# Patient Record
Sex: Female | Born: 1940
Health system: Southern US, Community
[De-identification: ages and names within clinical notes are randomized; demographics above are authoritative.]

## PROBLEM LIST (undated history)

## (undated) DIAGNOSIS — J449 Chronic obstructive pulmonary disease, unspecified: Secondary | ICD-10-CM

## (undated) DIAGNOSIS — R51 Headache: Secondary | ICD-10-CM

## (undated) DIAGNOSIS — I509 Heart failure, unspecified: Secondary | ICD-10-CM

## (undated) DIAGNOSIS — K5909 Other constipation: Secondary | ICD-10-CM

## (undated) DIAGNOSIS — M545 Low back pain, unspecified: Secondary | ICD-10-CM

## (undated) DIAGNOSIS — F419 Anxiety disorder, unspecified: Secondary | ICD-10-CM

## (undated) DIAGNOSIS — I209 Angina pectoris, unspecified: Secondary | ICD-10-CM

## (undated) DIAGNOSIS — D649 Anemia, unspecified: Secondary | ICD-10-CM

## (undated) DIAGNOSIS — I10 Essential (primary) hypertension: Secondary | ICD-10-CM

## (undated) DIAGNOSIS — K219 Gastro-esophageal reflux disease without esophagitis: Secondary | ICD-10-CM

## (undated) DIAGNOSIS — M1990 Unspecified osteoarthritis, unspecified site: Secondary | ICD-10-CM

## (undated) HISTORY — DX: Gastro-esophageal reflux disease without esophagitis: K21.9

## (undated) HISTORY — PX: ABDOMINAL HYSTERECTOMY: SHX81

## (undated) HISTORY — DX: Unspecified osteoarthritis, unspecified site: M19.90

## (undated) HISTORY — DX: Other constipation: K59.09

## (undated) HISTORY — DX: Low back pain: M54.5

## (undated) HISTORY — DX: Essential (primary) hypertension: I10

## (undated) HISTORY — DX: Low back pain, unspecified: M54.50

## (undated) HISTORY — PX: KNEE SURGERY: SHX244

---

## 2005-03-07 ENCOUNTER — Ambulatory Visit: Payer: Self-pay | Admitting: Physical Medicine & Rehabilitation

## 2005-03-07 ENCOUNTER — Inpatient Hospital Stay (HOSPITAL_COMMUNITY): Admission: RE | Admit: 2005-03-07 | Discharge: 2005-03-13 | Payer: Self-pay | Admitting: Specialist

## 2005-06-02 ENCOUNTER — Ambulatory Visit (HOSPITAL_COMMUNITY): Admission: RE | Admit: 2005-06-02 | Discharge: 2005-06-02 | Payer: Self-pay | Admitting: Specialist

## 2011-08-01 DIAGNOSIS — N189 Chronic kidney disease, unspecified: Secondary | ICD-10-CM

## 2011-08-01 HISTORY — DX: Chronic kidney disease, unspecified: N18.9

## 2011-11-09 ENCOUNTER — Ambulatory Visit (INDEPENDENT_AMBULATORY_CARE_PROVIDER_SITE_OTHER): Payer: Medicare Other | Admitting: Gastroenterology

## 2011-11-09 ENCOUNTER — Encounter: Payer: Self-pay | Admitting: Gastroenterology

## 2011-11-09 VITALS — BP 112/70 | HR 88 | Temp 97.5°F | Ht 64.0 in | Wt 209.8 lb

## 2011-11-09 DIAGNOSIS — R590 Localized enlarged lymph nodes: Secondary | ICD-10-CM

## 2011-11-09 DIAGNOSIS — Z1211 Encounter for screening for malignant neoplasm of colon: Secondary | ICD-10-CM

## 2011-11-09 DIAGNOSIS — K5900 Constipation, unspecified: Secondary | ICD-10-CM

## 2011-11-09 DIAGNOSIS — R599 Enlarged lymph nodes, unspecified: Secondary | ICD-10-CM

## 2011-11-09 MED ORDER — SOD PICOSULFATE-MAG OX-CIT ACD 10-3.5-12 MG-GM-GM PO PACK: 1.0000 | PACK | Freq: Once | ORAL | Status: DC

## 2011-11-09 NOTE — Progress Notes (Signed)
Primary Care Physician:  SHAH,ASHISH, MD, MD Primary Gastroenterologist:  Dr. Rourk  Chief Complaint  Patient presents with  . Colonoscopy    HPI:   [name] presents today at the request of Dr. Shah for an initial screening colonoscopy. Reports chronic constipation, takes Metamucil with good results. Denies any rectal bleeding. Notes hx of reflux, GERD controls well. States lower abdominal bloating, discomfort with constipation, then relieved. No wt loss or lack of appetite. States no energy for years. However, notes stressors such as worrying about family, children, grandchildren.      Past Medical History  Diagnosis Date  . Osteoarthritis   . Lower back pain   . GERD (gastroesophageal reflux disease)   . Chronic constipation   . Hypertension     Past Surgical History  Procedure Date  . Knee surgery     right  . Abdominal hysterectomy     Current Outpatient Prescriptions  Medication Sig Dispense Refill  . ALPRAZolam (XANAX) 0.5 MG tablet Take 0.5 mg by mouth at bedtime as needed.       . amLODipine-benazepril (LOTREL) 10-20 MG per capsule Take 1 capsule by mouth daily.       . calcium carbonate (OS-CAL) 600 MG TABS Take 600 mg by mouth 2 (two) times daily with a meal.      . fexofenadine (ALLEGRA) 180 MG tablet Take 180 mg by mouth daily.      . fish oil-omega-3 fatty acids 1000 MG capsule Take 2 g by mouth daily.      . furosemide (LASIX) 40 MG tablet Take 40 mg by mouth daily.       . ibuprofen (ADVIL,MOTRIN) 800 MG tablet Take 800 mg by mouth every 8 (eight) hours as needed.       . KLOR-CON M20 20 MEQ tablet Take 20 mEq by mouth daily.       . metaxalone (SKELAXIN) 800 MG tablet Take 800 mg by mouth 3 (three) times daily.      . NEXIUM 40 MG capsule Take 40 mg by mouth daily before breakfast.       . Oxycodone HCl 20 MG TABS Take 20 mg by mouth every 4 (four) hours while awake.       . venlafaxine (EFFEXOR) 75 MG tablet Take 75 mg by mouth 2 (two) times daily.        . peg 3350 powder (MOVIPREP) SOLR Take 1 kit (100 g total) by mouth once. As directed Please purchase 1 Fleets enema to use with the prep  1 kit  0  . Sod Picosulfate-Mag Ox-Cit Acd (PREPOPIK) 10-3.5-12 MG-GM-GM PACK Take 1 kit by mouth once.  1 each  0    Allergies as of 11/09/2011 - Review Complete 11/09/2011  Allergen Reaction Noted  . Hydrocodone Itching 11/09/2011    Family History  Problem Relation Age of Onset  . Colon cancer Neg Hx     History   Social History  . Marital Status: Single    Spouse Name: N/A    Number of Children: N/A  . Years of Education: N/A   Occupational History  . Not on file.   Social History Main Topics  . Smoking status: Former Smoker -- 0.5 packs/day    Types: Cigarettes  . Smokeless tobacco: Not on file   Comment: quit about 35+ years ago  . Alcohol Use: Yes     very little(wine)  . Drug Use: No  . Sexually Active: Not on file     Other Topics Concern  . Not on file   Social History Narrative  . No narrative on file    Review of Systems: Gen: Denies any fever, chills, fatigue, weight loss, lack of appetite.  CV: Denies chest pain, heart palpitations, peripheral edema, syncope.  Resp: Denies shortness of breath at rest or with exertion. Denies wheezing or cough.  GI: Denies dysphagia or odynophagia. Denies jaundice, hematemesis, fecal incontinence. GU : Denies urinary burning, urinary frequency, urinary hesitancy MS: Denies joint pain, muscle weakness, cramps, or limitation of movement.  Derm: Denies rash, itching, dry skin Psych: Denies depression, anxiety, memory loss, and confusion Heme: Denies bruising, bleeding, and enlarged lymph nodes.  Physical Exam: BP 112/70  Pulse 88  Temp(Src) 97.5 F (36.4 C) (Temporal)  Ht 5' 4" (1.626 m)  Wt 209 lb 12.8 oz (95.165 kg)  BMI 36.01 kg/m2 General:   Alert and oriented. Pleasant and cooperative. Well-nourished and well-developed.  Head:  Normocephalic and atraumatic. Eyes:   Without icterus, sclera clear and conjunctiva pink.  Ears:  Normal auditory acuity. Nose:  No deformity, discharge,  or lesions. Mouth:  No deformity or lesions, oral mucosa pink.  Neck:  Supple, without mass or thyromegaly. Lungs:  Clear to auscultation bilaterally. No wheezes, rales, or rhonchi. No distress.  Heart:  S1, S2 present without murmurs appreciated.  Abdomen:  +BS, soft, non-tender and non-distended. No HSM noted. No guarding or rebound. No masses appreciated.  Rectal:  Deferred  Msk:  Symmetrical without gross deformities. Normal posture. Extremities:  Without clubbing or edema. Neurologic:  Alert and  oriented x4;  grossly normal neurologically. Skin:  Intact without significant lesions or rashes. Cervical Nodes:  Right submandibular cervical adenopathy, non-tender, non-fixed. Pt states recent sinus flare.  Psych:  Alert and cooperative. Normal mood and affect.    

## 2011-11-09 NOTE — Patient Instructions (Addendum)
We have set you up for a colonoscopy with Dr. Jena Gauss in the near future.   Continue to take supplemental fiber.   You may take a probiotic daily. This can be found over the counter. We have provided samples.   Make sure to follow-up with Dr. Sherryll Burger as already planned. We will be sending this note to him as well.

## 2011-11-10 ENCOUNTER — Encounter: Payer: Self-pay | Admitting: Gastroenterology

## 2011-11-10 ENCOUNTER — Other Ambulatory Visit: Payer: Self-pay | Admitting: Gastroenterology

## 2011-11-10 MED ORDER — PEG-KCL-NACL-NASULF-NA ASC-C 100 G PO SOLR: 1.0000 | Freq: Once | ORAL | Status: DC

## 2011-11-11 DIAGNOSIS — R590 Localized enlarged lymph nodes: Secondary | ICD-10-CM | POA: Insufficient documentation

## 2011-11-11 DIAGNOSIS — Z1211 Encounter for screening for malignant neoplasm of colon: Secondary | ICD-10-CM | POA: Insufficient documentation

## 2011-11-11 DIAGNOSIS — K5900 Constipation, unspecified: Secondary | ICD-10-CM | POA: Insufficient documentation

## 2011-11-11 NOTE — Assessment & Plan Note (Signed)
Continue Metamucil. Add Probiotic. Screening TCS in near future.

## 2011-11-11 NOTE — Assessment & Plan Note (Signed)
Non-tender submandibular cervical adenopathy, right side. Notes recent sinus issues. Will cc PCP on this. Likely reactive. Doubt underlying issue. Keep appt with Dr. Sherryll Burger that is upcoming.

## 2011-11-11 NOTE — Assessment & Plan Note (Addendum)
71 year old female with need for initial screening colonoscopy. Chronic constipation, relieved with Metamucil prn. No rectal bleeding, abdominal pain. No FH of colon cancer. No other symptoms.   Proceed with TCS with Dr. Jena Gauss in near future: the risks, benefits, and alternatives have been discussed with the patient in detail. The patient states understanding and desires to proceed. Phenergan 12.5 mg IV prior due to polypharmacy.

## 2011-11-13 NOTE — Progress Notes (Signed)
Faxed to PCP

## 2011-11-29 ENCOUNTER — Encounter (HOSPITAL_COMMUNITY): Payer: Self-pay | Admitting: Pharmacy Technician

## 2011-11-29 MED ORDER — SODIUM CHLORIDE 0.45 % IV SOLN
Freq: Once | INTRAVENOUS | Status: AC
  Administered 2011-11-30: 09:00:00 via INTRAVENOUS

## 2011-11-30 ENCOUNTER — Encounter (HOSPITAL_COMMUNITY): Payer: Self-pay

## 2011-11-30 ENCOUNTER — Encounter (HOSPITAL_COMMUNITY)
Admission: RE | Disposition: A | Discharge status: Home Health | Payer: Self-pay | Source: Ambulatory Visit | Attending: Internal Medicine

## 2011-11-30 ENCOUNTER — Ambulatory Visit (HOSPITAL_COMMUNITY)
Admission: RE | Admit: 2011-11-30 | Discharge: 2011-11-30 | Disposition: A | Discharge status: Home Health | Payer: Medicare Other | Source: Ambulatory Visit | Attending: Internal Medicine | Admitting: Internal Medicine

## 2011-11-30 DIAGNOSIS — I10 Essential (primary) hypertension: Secondary | ICD-10-CM | POA: Insufficient documentation

## 2011-11-30 DIAGNOSIS — K648 Other hemorrhoids: Secondary | ICD-10-CM | POA: Insufficient documentation

## 2011-11-30 DIAGNOSIS — Z1211 Encounter for screening for malignant neoplasm of colon: Secondary | ICD-10-CM

## 2011-11-30 DIAGNOSIS — Z79899 Other long term (current) drug therapy: Secondary | ICD-10-CM | POA: Insufficient documentation

## 2011-11-30 HISTORY — PX: COLONOSCOPY: SHX5424

## 2011-11-30 SURGERY — COLONOSCOPY
Anesthesia: Moderate Sedation

## 2011-11-30 MED ORDER — MEPERIDINE HCL 100 MG/ML IJ SOLN
INTRAMUSCULAR | Status: DC | PRN
  Administered 2011-11-30 (×4): 25 mg via INTRAVENOUS
  Administered 2011-11-30: 50 mg via INTRAVENOUS

## 2011-11-30 MED ORDER — MEPERIDINE HCL 100 MG/ML IJ SOLN
INTRAMUSCULAR | Status: AC
  Filled 2011-11-30: qty 2

## 2011-11-30 MED ORDER — STERILE WATER FOR IRRIGATION IR SOLN
Status: DC | PRN
  Administered 2011-11-30: 11:00:00

## 2011-11-30 MED ORDER — MIDAZOLAM HCL 5 MG/5ML IJ SOLN
INTRAMUSCULAR | Status: DC | PRN
  Administered 2011-11-30: 2 mg via INTRAVENOUS
  Administered 2011-11-30 (×2): 1 mg via INTRAVENOUS
  Administered 2011-11-30: 2 mg via INTRAVENOUS
  Administered 2011-11-30: 1 mg via INTRAVENOUS
  Administered 2011-11-30: 2 mg via INTRAVENOUS

## 2011-11-30 MED ORDER — PROMETHAZINE HCL 25 MG/ML IJ SOLN
INTRAMUSCULAR | Status: AC
  Administered 2011-11-30: 12.5 mg via INTRAVENOUS
  Filled 2011-11-30: qty 1

## 2011-11-30 MED ORDER — PROMETHAZINE HCL 25 MG/ML IJ SOLN
12.5000 mg | Freq: Once | INTRAMUSCULAR | Status: AC
  Administered 2011-11-30: 12.5 mg via INTRAVENOUS

## 2011-11-30 MED ORDER — MIDAZOLAM HCL 5 MG/5ML IJ SOLN
INTRAMUSCULAR | Status: AC
  Filled 2011-11-30: qty 10

## 2011-11-30 NOTE — Progress Notes (Signed)
IV discontinued from right wrist. Patient arrived to post-op with IV to right wrist. Site clean, dry, and intact.

## 2011-11-30 NOTE — Discharge Instructions (Signed)

## 2011-11-30 NOTE — H&P (View-Only) (Signed)
Primary Care Physician:  Kirstie Peri, MD, MD Primary Gastroenterologist:  Dr. Jena Gauss  Chief Complaint  Patient presents with  . Colonoscopy    HPI:   [name] presents today at the request of Dr. Sherryll Burger for an initial screening colonoscopy. Reports chronic constipation, takes Metamucil with good results. Denies any rectal bleeding. Notes hx of reflux, GERD controls well. States lower abdominal bloating, discomfort with constipation, then relieved. No wt loss or lack of appetite. States no energy for years. However, notes stressors such as worrying about family, children, grandchildren.      Past Medical History  Diagnosis Date  . Osteoarthritis   . Lower back pain   . GERD (gastroesophageal reflux disease)   . Chronic constipation   . Hypertension     Past Surgical History  Procedure Date  . Knee surgery     right  . Abdominal hysterectomy     Current Outpatient Prescriptions  Medication Sig Dispense Refill  . ALPRAZolam (XANAX) 0.5 MG tablet Take 0.5 mg by mouth at bedtime as needed.       Marland Kitchen amLODipine-benazepril (LOTREL) 10-20 MG per capsule Take 1 capsule by mouth daily.       . calcium carbonate (OS-CAL) 600 MG TABS Take 600 mg by mouth 2 (two) times daily with a meal.      . fexofenadine (ALLEGRA) 180 MG tablet Take 180 mg by mouth daily.      . fish oil-omega-3 fatty acids 1000 MG capsule Take 2 g by mouth daily.      . furosemide (LASIX) 40 MG tablet Take 40 mg by mouth daily.       Marland Kitchen ibuprofen (ADVIL,MOTRIN) 800 MG tablet Take 800 mg by mouth every 8 (eight) hours as needed.       Marland Kitchen KLOR-CON M20 20 MEQ tablet Take 20 mEq by mouth daily.       . metaxalone (SKELAXIN) 800 MG tablet Take 800 mg by mouth 3 (three) times daily.      Marland Kitchen NEXIUM 40 MG capsule Take 40 mg by mouth daily before breakfast.       . Oxycodone HCl 20 MG TABS Take 20 mg by mouth every 4 (four) hours while awake.       . venlafaxine (EFFEXOR) 75 MG tablet Take 75 mg by mouth 2 (two) times daily.        . peg 3350 powder (MOVIPREP) SOLR Take 1 kit (100 g total) by mouth once. As directed Please purchase 1 Fleets enema to use with the prep  1 kit  0  . Sod Picosulfate-Mag Ox-Cit Acd (PREPOPIK) 10-3.5-12 MG-GM-GM PACK Take 1 kit by mouth once.  1 each  0    Allergies as of 11/09/2011 - Review Complete 11/09/2011  Allergen Reaction Noted  . Hydrocodone Itching 11/09/2011    Family History  Problem Relation Age of Onset  . Colon cancer Neg Hx     History   Social History  . Marital Status: Single    Spouse Name: N/A    Number of Children: N/A  . Years of Education: N/A   Occupational History  . Not on file.   Social History Main Topics  . Smoking status: Former Smoker -- 0.5 packs/day    Types: Cigarettes  . Smokeless tobacco: Not on file   Comment: quit about 35+ years ago  . Alcohol Use: Yes     very little(wine)  . Drug Use: No  . Sexually Active: Not on file  Other Topics Concern  . Not on file   Social History Narrative  . No narrative on file    Review of Systems: Gen: Denies any fever, chills, fatigue, weight loss, lack of appetite.  CV: Denies chest pain, heart palpitations, peripheral edema, syncope.  Resp: Denies shortness of breath at rest or with exertion. Denies wheezing or cough.  GI: Denies dysphagia or odynophagia. Denies jaundice, hematemesis, fecal incontinence. GU : Denies urinary burning, urinary frequency, urinary hesitancy MS: Denies joint pain, muscle weakness, cramps, or limitation of movement.  Derm: Denies rash, itching, dry skin Psych: Denies depression, anxiety, memory loss, and confusion Heme: Denies bruising, bleeding, and enlarged lymph nodes.  Physical Exam: BP 112/70  Pulse 88  Temp(Src) 97.5 F (36.4 C) (Temporal)  Ht 5\' 4"  (1.626 m)  Wt 209 lb 12.8 oz (95.165 kg)  BMI 36.01 kg/m2 General:   Alert and oriented. Pleasant and cooperative. Well-nourished and well-developed.  Head:  Normocephalic and atraumatic. Eyes:   Without icterus, sclera clear and conjunctiva pink.  Ears:  Normal auditory acuity. Nose:  No deformity, discharge,  or lesions. Mouth:  No deformity or lesions, oral mucosa pink.  Neck:  Supple, without mass or thyromegaly. Lungs:  Clear to auscultation bilaterally. No wheezes, rales, or rhonchi. No distress.  Heart:  S1, S2 present without murmurs appreciated.  Abdomen:  +BS, soft, non-tender and non-distended. No HSM noted. No guarding or rebound. No masses appreciated.  Rectal:  Deferred  Msk:  Symmetrical without gross deformities. Normal posture. Extremities:  Without clubbing or edema. Neurologic:  Alert and  oriented x4;  grossly normal neurologically. Skin:  Intact without significant lesions or rashes. Cervical Nodes:  Right submandibular cervical adenopathy, non-tender, non-fixed. Pt states recent sinus flare.  Psych:  Alert and cooperative. Normal mood and affect.

## 2011-11-30 NOTE — Interval H&P Note (Signed)
History and Physical Interval Note:  11/30/2011 10:39 AM  Jessica Lopez  has presented today for surgery, with the diagnosis of screening CRC  The various methods of treatment have been discussed with the patient and family. After consideration of risks, benefits and other options for treatment, the patient has consented to  Procedure(s) (LRB): COLONOSCOPY (N/A) as a surgical intervention .  The patients' history has been reviewed, patient examined, no change in status, stable for surgery.  I have reviewed the patients' chart and labs.  Questions were answered to the patient's satisfaction.     Eula Listen

## 2011-11-30 NOTE — Op Note (Signed)
[facility] [address] [place], [place]  [address]  COLONOSCOPY PROCEDURE REPORT  PATIENT:  Jessica Lopez, Jessica Lopez  MR#:  [id number] BIRTHDATE:  1941-01-10, 70 yrs. old  GENDER:  female ENDOSCOPIST:  R. Roetta Sessions, MD FACP [facility] REF. BY:  Kirstie Peri, M.D. PROCEDURE DATE:  11/30/2011 PROCEDURE:  screening colonoscopy.  INDICATIONS:  first ever average risk screening examination.  INFORMED CONSENT:  The risks, benefits, alternatives and imponderables including but not limited to bleeding, perforation as well as the possibility of a missed lesion have been reviewed. The potential for biopsy, lesion removal, etc. have also been discussed.  Questions have been answered.  All parties agreeable. Please see the history and physical in the medical record for more information.  MEDICATIONS:  Versed 9 mg IV and Demerol 150 mg IV in divided doses.  DESCRIPTION OF PROCEDURE:  After a digital rectal exam was performed, the EC-[id number] ([id number]) colonoscope was advanced from the anus through the rectum and colon to the area of the cecum, ileocecal valve and appendiceal orifice.  The cecum was deeply intubated.  These structures were well-seen and photographed for the record.  From the level of the cecum and ileocecal valve, the scope was slowly and cautiously withdrawn.  The mucosal surfaces were carefully surveyed utilizing scope tip deflection to facilitate fold flattening as needed.  The scope was pulled down into the rectum where a thorough examination including retroflexion was performed. <<PROCEDUREIMAGES>>  FINDINGS:  adequate preparation.  anal papilla and internal hemorrhoids otherwise normal rectum. Somewhat long, tortuous, but otherwise normal-appearing colonic mucosa.  THERAPEUTIC / DIAGNOSTIC MANEUVERS PERFORMED:none  COMPLICATIONS:  none  CECAL WITHDRAWAL TIME:10 minutes  IMPRESSION:   Anal papilla/internal hemorrhoids.  Rectum and colon appeared otherwise  normal  RECOMMENDATIONS:    Repeat screening colonoscopy in 10 years  ______________________________ R. Roetta Sessions, MD Caleen Essex  CC:  Kirstie Peri, M.D.  n. eSIGNED:   R. Roetta Sessions at 11/30/2011 11:23 AM  Malena Peer, [phone]

## 2011-12-04 ENCOUNTER — Encounter (HOSPITAL_COMMUNITY): Payer: Self-pay | Admitting: Internal Medicine

## 2013-01-24 ENCOUNTER — Encounter: Payer: Self-pay | Admitting: Internal Medicine

## 2013-01-27 ENCOUNTER — Ambulatory Visit (INDEPENDENT_AMBULATORY_CARE_PROVIDER_SITE_OTHER): Payer: Medicare Other | Admitting: Gastroenterology

## 2013-01-27 ENCOUNTER — Encounter: Payer: Self-pay | Admitting: Gastroenterology

## 2013-01-27 VITALS — BP 152/70 | HR 75 | Temp 97.8°F | Ht 64.0 in | Wt 226.4 lb

## 2013-01-27 DIAGNOSIS — R635 Abnormal weight gain: Secondary | ICD-10-CM | POA: Insufficient documentation

## 2013-01-27 DIAGNOSIS — K5900 Constipation, unspecified: Secondary | ICD-10-CM

## 2013-01-27 MED ORDER — LINACLOTIDE 290 MCG PO CAPS: 290.0000 ug | ORAL_CAPSULE | Freq: Every day | ORAL | Status: AC

## 2013-01-27 NOTE — Assessment & Plan Note (Addendum)
72 y/o with constipation in setting of chronic narcotics. Suspect abdominal discomfort related to constipation. She is up-to-date on her colonoscopy as her last one was one year ago. No indication for colonoscopy at this time.  1. Increase Linzess to daily. 2. Continue probiotic. 3. Continue Metamucil 4. Keep stool Journal. 5. Increase dietary fiber and fluid intake. 6. Office visit in 6 weeks. If no significant improvement in her abdominal discomfort despite improvement in her bowel function, consider CT of the abdomen and pelvis at that time. 7. Call sooner if needed.

## 2013-01-27 NOTE — Progress Notes (Signed)
Cc PCP 

## 2013-01-27 NOTE — Assessment & Plan Note (Signed)
Given increased weight and change in bowel function we will check thyroid status.

## 2013-01-27 NOTE — Progress Notes (Signed)
Primary Care Physician: Kirstie Peri, MD Referring Physician:  Reather Littler, MD Primary Gastroenterologist:  Roetta Sessions, MD   Chief Complaint  Patient presents with  . Constipation    HPI: Jessica Lopez is a 72 y.o. female here for further evaluation of severe constipation. She had a colonoscopy in May 2013 by Dr. Jena Gauss. She had anal papilla/internal hemorrhoids otherwise normal exam. She will not need another colonoscopy for 10 years unless she develops any significant problems.  Patient complains of worsening baseline constipation over the past several months. She has been on [employer] daily. Usually takes Metamucil prn. Recently was started on Amitiza by Dr. Andrez Grime for about one month. Now on Linzess daily by Dr. Sherryll Burger. C/O 6 week h/o mid-abdominal discomfort. Dr. Sherryll Burger and Dr. Glade Lloyd thinks she has pain secondary to constipation. BM couple of times per week if she stays on her medications. No rectal pain/bleeding. No melena. No n/v, anorexia. Heartburn ok on Nexium. Weight up 17 pounds. Had went to weight loss center and lost a lot of weight two years ago. Chronic oxycodone for back/knee pain. Takes on regular schedule.   Current Outpatient Prescriptions  Medication Sig Dispense Refill  . ALPRAZolam (XANAX) 0.25 MG tablet 1 tablet 4 (four) times daily as needed.      Marland Kitchen amLODipine-benazepril (LOTREL) 5-20 MG per capsule 1 capsule daily.      . bifidobacterium infantis (ALIGN) capsule Take 1 capsule by mouth daily.      . calcium carbonate (OS-CAL) 600 MG TABS Take 600 mg by mouth 2 (two) times daily with a meal.      . DULoxetine (CYMBALTA) 60 MG capsule Take 60 mg by mouth daily.       . fexofenadine (ALLEGRA) 180 MG tablet Take 180 mg by mouth daily.      . fish oil-omega-3 fatty acids 1000 MG capsule Take 2 g by mouth daily.      . furosemide (LASIX) 40 MG tablet Take 40 mg by mouth daily.       Marland Kitchen ibuprofen (ADVIL,MOTRIN) 800 MG tablet Take 800 mg by mouth every 8  (eight) hours as needed.       Marland Kitchen KLOR-CON M20 20 MEQ tablet Take 20 mEq by mouth daily.       Marland Kitchen LINZESS 145 MCG CAPS Take 145 mcg by mouth daily.       . metaxalone (SKELAXIN) 800 MG tablet Take 800 mg by mouth 3 (three) times daily.      Marland Kitchen NEXIUM 40 MG capsule Take 40 mg by mouth daily before breakfast.       . Oxycodone HCl 20 MG TABS Take 20 mg by mouth every 4 (four) hours while awake.       . rizatriptan (MAXALT) 10 MG tablet Take 10 mg by mouth as needed.       Marland Kitchen tiZANidine (ZANAFLEX) 4 MG tablet Take 4 mg by mouth every 8 (eight) hours as needed.       . venlafaxine (EFFEXOR) 75 MG tablet Take 75 mg by mouth 2 (two) times daily.        No current facility-administered medications for this visit.    Allergies as of 01/27/2013 - Review Complete 01/27/2013  Allergen Reaction Noted  . Hydrocodone Itching 11/09/2011    ROS:  General: Negative for anorexia, weight loss, fever, chills, fatigue, weakness. ENT: Negative for hoarseness, difficulty swallowing , nasal congestion. CV: Negative for chest pain, angina, palpitations, dyspnea on exertion, peripheral edema.  Respiratory: Negative  for dyspnea at rest, dyspnea on exertion, cough, sputum, wheezing.  GI: See history of present illness. GU:  Negative for dysuria, hematuria, urinary incontinence, urinary frequency, nocturnal urination.  Endo: Negative for unusual weight change.    Physical Examination:   BP 152/70  Pulse 75  Temp(Src) 97.8 F (36.6 C) (Oral)  Ht 5\' 4"  (1.626 m)  Wt 226 lb 6.4 oz (102.694 kg)  BMI 38.84 kg/m2  General: Well-nourished, well-developed in no acute distress.  Eyes: No icterus. Mouth: Oropharyngeal mucosa moist and pink , no lesions erythema or exudate. Lungs: Clear to auscultation bilaterally.  Heart: Regular rate and rhythm, no murmurs rubs or gallops.  Abdomen: Bowel sounds are normal, nontender, nondistended, no hepatosplenomegaly or masses, no abdominal bruits or hernia , no rebound or  guarding.   Extremities: No lower extremity edema. No clubbing or deformities. Neuro: Alert and oriented x 4   Skin: Warm and dry, no jaundice.   Psych: Alert and cooperative, normal mood and affect.   Imaging Studies: No results found.

## 2013-01-27 NOTE — Patient Instructions (Addendum)
1. Please stop Linzess you are on now. Start increased dose of one pill daily. New RX sent to your pharmacy. 2. Lab work to check thyroid. 3. Keep a stool internal. Document when you have a bowel movement, whether is soft or hard, when you have abdominal pain. Please document each day if you did not take the align and Linzess or if you added Metamucil. 4. Office visit in six weeks. Please call if you do not get relief from your constipation or your abdominal pain worsens.  High-Fiber Diet Fiber is found in fruits, vegetables, and grains. A high-fiber diet encourages the addition of more whole grains, legumes, fruits, and vegetables in your diet. The recommended amount of fiber for adult males is 38 g per day. For adult females, it is 25 g per day. Pregnant and lactating women should get 28 g of fiber per day. If you have a digestive or bowel problem, ask your caregiver for advice before adding high-fiber foods to your diet. Eat a variety of high-fiber foods instead of only a select few type of foods.  PURPOSE  To increase stool bulk.  To make bowel movements more regular to prevent constipation.  To lower cholesterol.  To prevent overeating. WHEN IS THIS DIET USED?  It may be used if you have constipation and hemorrhoids.  It may be used if you have uncomplicated diverticulosis (intestine condition) and irritable bowel syndrome.  It may be used if you need help with weight management.  It may be used if you want to add it to your diet as a protective measure against atherosclerosis, diabetes, and cancer. SOURCES OF FIBER  Whole-grain breads and cereals.  Fruits, such as apples, oranges, bananas, berries, prunes, and pears.  Vegetables, such as green peas, carrots, sweet potatoes, beets, broccoli, cabbage, spinach, and artichokes.  Legumes, such split peas, soy, lentils.  Almonds. FIBER CONTENT IN FOODS Starches and Grains / Dietary Fiber (g)  Cheerios, 1 cup / 3  g  Corn Flakes cereal, 1 cup / 0.7 g  Rice crispy treat cereal, 1 cup / 0.3 g  Instant oatmeal (cooked),  cup / 2 g  Frosted wheat cereal, 1 cup / 5.1 g  Brown, long-grain rice (cooked), 1 cup / 3.5 g  White, long-grain rice (cooked), 1 cup / 0.6 g  Enriched macaroni (cooked), 1 cup / 2.5 g Legumes / Dietary Fiber (g)  Baked beans (canned, plain, or vegetarian),  cup / 5.2 g  Kidney beans (canned),  cup / 6.8 g  Pinto beans (cooked),  cup / 5.5 g Breads and Crackers / Dietary Fiber (g)  Plain or honey graham crackers, 2 squares / 0.7 g  Saltine crackers, 3 squares / 0.3 g  Plain, salted pretzels, 10 pieces / 1.8 g  Whole-wheat bread, 1 slice / 1.9 g  White bread, 1 slice / 0.7 g  Raisin bread, 1 slice / 1.2 g  Plain bagel, 3 oz / 2 g  Flour tortilla, 1 oz / 0.9 g  Corn tortilla, 1 small / 1.5 g  Hamburger or hotdog bun, 1 small / 0.9 g Fruits / Dietary Fiber (g)  Apple with skin, 1 medium / 4.4 g  Sweetened applesauce,  cup / 1.5 g  Banana,  medium / 1.5 g  Grapes, 10 grapes / 0.4 g  Orange, 1 small / 2.3 g  Raisin, 1.5 oz / 1.6 g  Melon, 1 cup / 1.4 g Vegetables / Dietary Fiber (g)  [place]  beans (canned),  cup / 1.3 g  Carrots (cooked),  cup / 2.3 g  Broccoli (cooked),  cup / 2.8 g  Peas (cooked),  cup / 4.4 g  Mashed potatoes,  cup / 1.6 g  Lettuce, 1 cup / 0.5 g  Corn (canned),  cup / 1.6 g  Tomato,  cup / 1.1 g Document Released: 07/17/2005 Document Revised: 01/16/2012 Document Reviewed: 10/19/2011 [facility] Patient Information 2014 [place], [place].  Constipation, Adult Constipation is when a person has fewer than 3 bowel movements a week; has difficulty having a bowel movement; or has stools that are dry, hard, or larger than normal. As people grow older, constipation is more common. If you try to fix constipation with medicines that make you have a bowel movement (laxatives), the problem may get worse. Long-term laxative  use may cause the muscles of the colon to become weak. A low-fiber diet, not taking in enough fluids, and taking certain medicines may make constipation worse. CAUSES   Certain medicines, such as antidepressants, pain medicine, iron supplements, antacids, and water pills.   Certain diseases, such as diabetes, irritable bowel syndrome (IBS), thyroid disease, or depression.   Not drinking enough water.   Not eating enough fiber-rich foods.   Stress or travel.  Lack of physical activity or exercise.  Not going to the restroom when there is the urge to have a bowel movement.  Ignoring the urge to have a bowel movement.  Using laxatives too much. SYMPTOMS   Having fewer than 3 bowel movements a week.   Straining to have a bowel movement.   Having hard, dry, or larger than normal stools.   Feeling full or bloated.   Pain in the lower abdomen.  Not feeling relief after having a bowel movement. DIAGNOSIS  Your caregiver will take a medical history and perform a physical exam. Further testing may be done for severe constipation. Some tests may include:   A barium enema X-ray to examine your rectum, colon, and sometimes, your small intestine.  A sigmoidoscopy to examine your lower colon.  A colonoscopy to examine your entire colon. TREATMENT  Treatment will depend on the severity of your constipation and what is causing it. Some dietary treatments include drinking more fluids and eating more fiber-rich foods. Lifestyle treatments may include regular exercise. If these diet and lifestyle recommendations do not help, your caregiver may recommend taking over-the-counter laxative medicines to help you have bowel movements. Prescription medicines may be prescribed if over-the-counter medicines do not work.  HOME CARE INSTRUCTIONS   Increase dietary fiber in your diet, such as fruits, vegetables, whole grains, and beans. Limit high-fat and processed sugars in your diet, such as  [place] fries, hamburgers, cookies, candies, and soda.   A fiber supplement may be added to your diet if you cannot get enough fiber from foods.   Drink enough fluids to keep your urine clear or pale yellow.   Exercise regularly or as directed by your caregiver.   Go to the restroom when you have the urge to go. Do not hold it.  Only take medicines as directed by your caregiver. Do not take other medicines for constipation without talking to your caregiver first. SEEK IMMEDIATE MEDICAL CARE IF:   You have bright red blood in your stool.   Your constipation lasts for more than 4 days or gets worse.   You have abdominal or rectal pain.   You have thin, pencil-like stools.  You have unexplained weight loss. MAKE  SURE YOU:   Understand these instructions.  Will watch your condition.  Will get help right away if you are not doing well or get worse. Document Released: 04/14/2004 Document Revised: 10/09/2011 Document Reviewed: 06/20/2011 [facility] Patient Information 2014 [place], [place].

## 2013-01-29 NOTE — Progress Notes (Signed)
Quick Note:  Please let pt knowTSH normal. ______

## 2013-01-30 NOTE — Progress Notes (Signed)
Quick Note:  Pt aware ______ 

## 2013-02-14 ENCOUNTER — Ambulatory Visit (INDEPENDENT_AMBULATORY_CARE_PROVIDER_SITE_OTHER): Payer: Medicare Other | Admitting: Urology

## 2013-02-14 DIAGNOSIS — R3129 Other microscopic hematuria: Secondary | ICD-10-CM

## 2013-02-14 DIAGNOSIS — R82998 Other abnormal findings in urine: Secondary | ICD-10-CM

## 2013-03-12 ENCOUNTER — Ambulatory Visit: Payer: Medicare Other | Admitting: Gastroenterology

## 2013-03-12 ENCOUNTER — Telehealth: Payer: Self-pay | Admitting: *Deleted

## 2013-03-12 NOTE — Telephone Encounter (Signed)
Pt was a no show

## 2013-03-25 ENCOUNTER — Other Ambulatory Visit: Payer: Self-pay | Admitting: Orthopedic Surgery

## 2013-03-26 ENCOUNTER — Encounter (HOSPITAL_COMMUNITY): Payer: Self-pay | Admitting: Pharmacy Technician

## 2013-03-28 ENCOUNTER — Ambulatory Visit: Payer: Medicare Other | Admitting: Urology

## 2013-04-03 ENCOUNTER — Other Ambulatory Visit: Payer: Self-pay | Admitting: Orthopedic Surgery

## 2013-04-03 NOTE — H&P (Signed)
Alfredo Batty DOB: Aug 04, 1940 Female  H&P date: 04/02/13  Chief complaint: Left knee pain  History of Present Illness The patient is a 72 year old female who comes in today for a preoperative History and Physical. The patient is scheduled for a left total knee arthroplasty to be performed by Dr. Javier Docker, MD at [facility] on April 10, 2013. She reports L knee pain for many years, progressively worsening. Refractory to steroid injections, bracing, activity modification, relative rest, opioid pain medications, NSAIDs, quad strengthening. Dr. Shelle Iron and the patient mutually agreed to proceed with a total knee replacement. Risks and benefits of the procedure were discussed including stiffness, suboptimal range of motion, persistent pain, infection requiring removal of prosthesis and reinsertion, need for prophylactic antibiotics in the future, for example, dental procedures, possible need for manipulation, revision in the future and also anesthetic complications including DVT, PE, etc. We discussed the perioperative course, time in the hospital, postoperative recovery and the need for elevation to control swelling. We also discussed the predicted range of motion and the probability that squatting and kneeling would be unobtainable in the future. In addition, postoperative anticoagulation was discussed. We will obtain preoperative medical clearance if necessary. Provided her illustrated handout and discussed it in detail. They will enroll in the total joint replacement educational forum at the hospital. She has been cleared by Dr. Sherryll Burger for surgery.  Past Medical Hx Anxiety Disorder Congestive Heart Failure Coronary artery disease Gastroesophageal Reflux Disease High blood pressure Hypercholesterolemia Osteoporosis Rheumatoid Arthritis  Allergies Hydrocodone/Acetaminophen *ANALGESICS - OPIOID*. itching at high doses  Family History Cancer. sister Diabetes  Mellitus. child  Social History Alcohol use. current drinker; drinks wine; only occasionally per week Children. 5 or more Current work status. retired Financial planner (Currently). no Drug/Alcohol Rehab (Previously). no Exercise. Exercises never Illicit drug use. no Living situation. live with spouse Marital status. married Number of flights of stairs before winded. less than 1 Pain Contract. yes Tobacco / smoke exposure. no Tobacco use. former smoker; smoke(d) less than 1/2 pack(s) per day Post-Surgical Plans. rehab  Medication History OxyCODONE HCl (20MG  Tablet, Oral) Active. ALPRAZolam ( Oral) Specific dose unknown - Active. Amlodipine Besy-Benazepril HCl (10-20MG  Capsule, Oral) Active. Cyclobenzaprine HCl (10MG  Tablet, Oral) Active. Furosemide (40MG  Tablet, Oral) Active. Klor-Con M20 ( Tablet ER, Oral) Active. NexIUM (40MG  Capsule DR, Oral) Active. FLUoxetine HCl (20MG  Capsule, Oral) Active. Folic Acid (1MG  Tablet, Oral) Active. Amitiza ( Capsule, Oral) Active. Vitamin D3 (1000UNIT Tablet, Oral) Active. Omega 3 ( Oral) Specific dose unknown - Active. Oxycodone HCl (20MG  Tablet ER, Oral) Active. Ibuprofen (800MG  Tablet, Oral) Active. TiZANidine HCl (4MG  Tablet, Oral) Active.  Pregnancy / Birth History Pregnant. no  Past Surgical History Hysterectomy. complete (non-cancerous) Total Knee Replacement. right  Review of Systems General:Present- Night Sweats and Fatigue. Not Present- Chills, Fever, Weight Gain, Weight Loss and Memory Loss. Skin:Not Present- Hives, Itching, Rash, Eczema and Lesions. HEENT:Not Present- Tinnitus, Headache, Double Vision, Visual Loss, Hearing Loss and Dentures. Respiratory:Not Present- Shortness of breath with exertion, Shortness of breath at rest, Allergies, Coughing up blood and Chronic Cough. Cardiovascular:Present- Swelling. Not Present- Chest Pain, Racing/skipping heartbeats, Difficulty Breathing  Lying Down, Murmur and Palpitations. Gastrointestinal:Present- Heartburn and Constipation. Not Present- Bloody Stool, Abdominal Pain, Vomiting, Nausea, Diarrhea, Difficulty Swallowing, Jaundice and Loss of appetitie. Female Genitourinary:Present- Blood in Urine. Not Present- Urinary frequency, Weak urinary stream, Discharge, Flank Pain, Incontinence, Painful Urination, Urgency, Urinary Retention and Urinating at Night. Musculoskeletal:Present- Joint Swelling and Back Pain. Not  Present- Muscle Weakness, Muscle Pain, Joint Pain, Morning Stiffness and Spasms. Neurological:Present- Difficulty with balance. Not Present- Tremor, Dizziness, Blackout spells, Paralysis and Weakness. Psychiatric:Not Present- Insomnia.  Physical Exam The physical exam findings are as follows:  General Mental Status - Alert, cooperative and good historian. General Appearance- pleasant. Not in acute distress. Orientation- Oriented X3. Build & Nutrition- Well nourished and Well developed.  Head and Neck Head- normocephalic, atraumatic . Neck Global Assessment- supple. no bruit auscultated on the right and no bruit auscultated on the left.  Eye Pupil- Bilateral- PERRLA. Motion- Bilateral- EOMI.  Chest and Lung Exam Auscultation: Breath sounds:- clear at anterior chest wall and - clear at posterior chest wall. Adventitious sounds:- No Adventitious sounds.  Cardiovascular Auscultation:Rhythm- Regular rate and rhythm. Heart Sounds- S1 WNL and S2 WNL. Murmurs & Other Heart Sounds:Auscultation of the heart reveals - No Murmurs.  Abdomen Palpation/Percussion:Tenderness- Abdomen is non-tender to palpation. Rigidity (guarding)- Abdomen is soft. Auscultation:Auscultation of the abdomen reveals - Bowel sounds normal.  Female Genitourinary Not done, not pertinent to present illness  Musculoskeletal Examination of the left knee, she has some valgus deformity. She is tender in the medial  joint line, tender in the lateral joint line. Patellofemoral pain with compression. No evidence of infection. No evidence of soft tissue swelling, ecchymosis, deformity or erythema. Nontender over the fibular head or the peroneal nerve. Nontender over the quadriceps insertion of the patellar ligament insertion. The range of motion was full. Provocative maneuvers revealed a negative Lachman's, negative anterior and posterior drawer, and a negative McMurray's. On manual motor test, the quadriceps and hamstrings were five over five. Sensory exam was intact to light touch. Antalgic gait, use of a cane.  Imaging X-rays of the left knee demonstrate bone-on-bone arthrosis lateral compartment, patellofemoral arthrosis.  Assessment & Plan DJD Left knee  Pt scheduled for L TKA on 04/10/13 by Dr. Shelle Iron. Discussed surgery itself as well as risks, complications, and alternatives including but not limited to DVT, PE, infx, bleeding, failure of procedure, need for secondary procedure, complex regional pain syndrome, scarring, need for manipulation, anesthesia risk, even death. Discussed typical post-op course, protocols, hospital stay, expected outcome, need for PT and HEP, working on regaining full extension and flexion to at least 110 degrees for a functional knee. She understands and desires to proceed. Plan for rehab after D/C from hospital. Plan Xarelto for DVT ppx. She will present to [facility] for pre-op appt 9/8 as scheduled. Remain NPO after MN. Hold NSAIDs, vitamins, and supplements accordingly. She will continue her pain mgmt medication in the interim. She will will up 10-14 days after surgery for staple removal and xrays.  Plan left total knee replacement  Signed electronically by Malala Spark, PA-C for Dr. Shelle Iron

## 2013-04-07 ENCOUNTER — Other Ambulatory Visit: Payer: Self-pay

## 2013-04-07 ENCOUNTER — Ambulatory Visit (HOSPITAL_COMMUNITY)
Admission: RE | Admit: 2013-04-07 | Discharge: 2013-04-07 | Disposition: A | Discharge status: Home / Self Care | Payer: Medicare Other | Source: Ambulatory Visit | Attending: Orthopedic Surgery | Admitting: Orthopedic Surgery

## 2013-04-07 ENCOUNTER — Encounter (HOSPITAL_COMMUNITY)
Admission: RE | Admit: 2013-04-07 | Discharge: 2013-04-07 | Disposition: A | Discharge status: Home / Self Care | Payer: Medicare Other | Source: Ambulatory Visit | Attending: Specialist | Admitting: Specialist

## 2013-04-07 ENCOUNTER — Encounter (HOSPITAL_COMMUNITY): Payer: Self-pay

## 2013-04-07 DIAGNOSIS — M1710 Unilateral primary osteoarthritis, unspecified knee: Secondary | ICD-10-CM | POA: Insufficient documentation

## 2013-04-07 DIAGNOSIS — Z01818 Encounter for other preprocedural examination: Secondary | ICD-10-CM | POA: Insufficient documentation

## 2013-04-07 DIAGNOSIS — I10 Essential (primary) hypertension: Secondary | ICD-10-CM | POA: Insufficient documentation

## 2013-04-07 DIAGNOSIS — Z01812 Encounter for preprocedural laboratory examination: Secondary | ICD-10-CM | POA: Insufficient documentation

## 2013-04-07 DIAGNOSIS — Z01810 Encounter for preprocedural cardiovascular examination: Secondary | ICD-10-CM | POA: Insufficient documentation

## 2013-04-07 HISTORY — DX: Headache: R51

## 2013-04-07 HISTORY — DX: Anemia, unspecified: D64.9

## 2013-04-07 HISTORY — DX: Heart failure, unspecified: I50.9

## 2013-04-07 HISTORY — DX: Anxiety disorder, unspecified: F41.9

## 2013-04-07 LAB — CBC
HCT: 40.9 % (ref 36.0–46.0)
Hemoglobin: 13.6 g/dL (ref 12.0–15.0)
MCV: 87.2 fL (ref 78.0–100.0)
RBC: 4.69 MIL/uL (ref 3.87–5.11)
WBC: 9.3 10*3/uL (ref 4.0–10.5)

## 2013-04-07 LAB — ABO/RH: ABO/RH(D): O POS

## 2013-04-07 LAB — BASIC METABOLIC PANEL
CO2: 30 mEq/L (ref 19–32)
Chloride: 101 mEq/L (ref 96–112)
GFR calc Af Amer: >90 mL/min (ref >90)
Potassium: 4.1 mEq/L (ref 3.5–5.1)

## 2013-04-07 LAB — URINALYSIS, ROUTINE W REFLEX MICROSCOPIC
Glucose, UA: NEGATIVE mg/dL
Ketones, ur: NEGATIVE mg/dL
Nitrite: NEGATIVE
Protein, ur: NEGATIVE mg/dL
Urobilinogen, UA: 0.2 mg/dL (ref 0.0–1.0)

## 2013-04-07 LAB — URINE MICROSCOPIC-ADD ON

## 2013-04-07 LAB — PROTIME-INR: INR: 0.87 (ref 0.00–1.49)

## 2013-04-07 NOTE — Progress Notes (Signed)
04/07/13 1613  OBSTRUCTIVE SLEEP APNEA  Have you ever been diagnosed with sleep apnea through a sleep study? No  Do you snore loudly (loud enough to be heard through closed doors)?  1  Do you often feel tired, fatigued, or sleepy during the daytime? 1  Has anyone observed you stop breathing during your sleep? 0  Do you have, or are you being treated for high blood pressure? 1  BMI more than 35 kg/m2? 1  Age over 73 years old? 1  Neck circumference greater than 40 cm/18 inches? 0  Gender: 0  Obstructive Sleep Apnea Score 5  Score 4 or greater  Results sent to PCP

## 2013-04-07 NOTE — Patient Instructions (Addendum)
20      Your procedure is scheduled on:  Thursday 04/10/2013  Report to [facility] at 0800 AM.  Call this number if you have problems the night before or morning of surgery: [phone]   Remember:             IF YOU USE CPAP,BRING MASK AND TUBING AM OF SURGERY!   Do not eat food or drink liquids AFTER MIDNIGHT!  Take these medicines the morning of surgery with A SIP OF WATER: Nexium, Cymbalta   Do not bring valuables to the hospital. [facility] IS NOT RESPONSIBLE  FOR ANY BELONGINGS OR VALUABLES BROUGHT TO HOSPITAL.  Marland Kitchen  Leave suitcase in the car. After surgery it may be brought to your room.  For patients admitted to the hospital, checkout time is 11:00 AM the day of              Discharge.    DO NOT WEAR JEWELRY , MAKE-UP, LOTIONS,POWDERS,PERFUMES!             WOMEN -DO NOT SHAVE LEGS OR UNDERARMS 12 HRS. BEFORE SURGERY!               MEN MAY SHAVE AS USUAL!             CONTACTS,DENTURES OR BRIDGEWORK, FALSE EYELASHES MAY  NOT BE WORN INTO SURGERY!                                           Patients discharged the day of surgery will not be allowed to drive home. If going home the same day of surgery, must have someone stay with you first 24 hrs.at home and arrange for someone to drive you home from the Hospital.                       YOUR DRIVER IS: niece-Delores   Special Instructions:             Please read over the following fact sheets that you were given:             1. [facility] PREPARING FOR SURGERY SHEET              2.INCENTIVE SPIROMETRY                                        [place].Jarvis Knodel,RN,BSN     [phone]                FAILURE TO FOLLOW THESE INSTRUCTIONS MAY RESULT IN CANCELLATION OF YOUR SURGERY!               Patient Signature:___________________________

## 2013-04-07 NOTE — Progress Notes (Signed)
Office note from Dr. Sherryll Burger 03/14/2013 and Echocardiogram from [facility] 11/04/2012 all on chart.

## 2013-04-08 ENCOUNTER — Other Ambulatory Visit: Payer: Self-pay | Admitting: Orthopedic Surgery

## 2013-04-08 NOTE — Progress Notes (Signed)
Faxed results of PCR screen to Dr. Shelle Iron via Fax machine.Received conformation transmission sheet. All on chart.

## 2013-04-10 ENCOUNTER — Encounter (HOSPITAL_COMMUNITY)
Admission: RE | Disposition: A | Discharge status: Skilled Nursing Facility | Payer: Self-pay | Source: Ambulatory Visit | Attending: Specialist

## 2013-04-10 ENCOUNTER — Encounter (HOSPITAL_COMMUNITY): Payer: Self-pay | Admitting: Anesthesiology

## 2013-04-10 ENCOUNTER — Inpatient Hospital Stay (HOSPITAL_COMMUNITY): Payer: Medicare Other

## 2013-04-10 ENCOUNTER — Encounter (HOSPITAL_COMMUNITY): Payer: Self-pay | Admitting: *Deleted

## 2013-04-10 ENCOUNTER — Inpatient Hospital Stay (HOSPITAL_COMMUNITY)
Admission: RE | Admit: 2013-04-10 | Discharge: 2013-04-13 | DRG: 470 | Disposition: A | Discharge status: Skilled Nursing Facility | Payer: Medicare Other | Source: Ambulatory Visit | Attending: Specialist | Admitting: Specialist

## 2013-04-10 ENCOUNTER — Inpatient Hospital Stay (HOSPITAL_COMMUNITY): Payer: Medicare Other | Admitting: Anesthesiology

## 2013-04-10 DIAGNOSIS — G8929 Other chronic pain: Secondary | ICD-10-CM | POA: Diagnosis present

## 2013-04-10 DIAGNOSIS — Z79899 Other long term (current) drug therapy: Secondary | ICD-10-CM

## 2013-04-10 DIAGNOSIS — M1710 Unilateral primary osteoarthritis, unspecified knee: Principal | ICD-10-CM | POA: Diagnosis present

## 2013-04-10 DIAGNOSIS — I2510 Atherosclerotic heart disease of native coronary artery without angina pectoris: Secondary | ICD-10-CM | POA: Diagnosis present

## 2013-04-10 DIAGNOSIS — E7800 Pure hypercholesterolemia, unspecified: Secondary | ICD-10-CM | POA: Diagnosis present

## 2013-04-10 DIAGNOSIS — Z87891 Personal history of nicotine dependence: Secondary | ICD-10-CM

## 2013-04-10 DIAGNOSIS — I10 Essential (primary) hypertension: Secondary | ICD-10-CM | POA: Diagnosis present

## 2013-04-10 DIAGNOSIS — M810 Age-related osteoporosis without current pathological fracture: Secondary | ICD-10-CM | POA: Diagnosis present

## 2013-04-10 DIAGNOSIS — K219 Gastro-esophageal reflux disease without esophagitis: Secondary | ICD-10-CM | POA: Diagnosis present

## 2013-04-10 DIAGNOSIS — Z6838 Body mass index (BMI) 38.0-38.9, adult: Secondary | ICD-10-CM

## 2013-04-10 DIAGNOSIS — M1712 Unilateral primary osteoarthritis, left knee: Secondary | ICD-10-CM

## 2013-04-10 DIAGNOSIS — F411 Generalized anxiety disorder: Secondary | ICD-10-CM | POA: Diagnosis present

## 2013-04-10 HISTORY — PX: TOTAL KNEE ARTHROPLASTY: SHX125

## 2013-04-10 LAB — TYPE AND SCREEN
ABO/RH(D): O POS
Antibody Screen: NEGATIVE

## 2013-04-10 SURGERY — ARTHROPLASTY, KNEE, TOTAL
Anesthesia: General | Site: Knee | Laterality: Left | Wound class: Clean

## 2013-04-10 MED ORDER — ONDANSETRON HCL 4 MG PO TABS: 4.0000 mg | ORAL_TABLET | Freq: Four times a day (QID) | ORAL | Status: DC | PRN

## 2013-04-10 MED ORDER — CLINDAMYCIN PHOSPHATE 900 MG/50ML IV SOLN
900.0000 mg | Freq: Three times a day (TID) | INTRAVENOUS | Status: DC
  Filled 2013-04-10: qty 50

## 2013-04-10 MED ORDER — SODIUM CHLORIDE 0.9 % IR SOLN
Status: DC | PRN
  Administered 2013-04-10: 1000 mL

## 2013-04-10 MED ORDER — KETAMINE HCL 10 MG/ML IJ SOLN
INTRAMUSCULAR | Status: DC | PRN
  Administered 2013-04-10: 20 mg via INTRAVENOUS

## 2013-04-10 MED ORDER — ESMOLOL HCL 10 MG/ML IV SOLN
INTRAVENOUS | Status: DC | PRN
  Administered 2013-04-10 (×4): 20 mg via INTRAVENOUS

## 2013-04-10 MED ORDER — HYDROMORPHONE HCL PF 1 MG/ML IJ SOLN
INTRAMUSCULAR | Status: AC
  Administered 2013-04-11: 1 mg via INTRAVENOUS
  Filled 2013-04-10: qty 1

## 2013-04-10 MED ORDER — HYDROMORPHONE HCL PF 1 MG/ML IJ SOLN
INTRAMUSCULAR | Status: DC | PRN
  Administered 2013-04-10: 0.5 mg via INTRAVENOUS
  Administered 2013-04-10: 1 mg via INTRAVENOUS
  Administered 2013-04-10: 0.5 mg via INTRAVENOUS
  Administered 2013-04-10 (×2): 1 mg via INTRAVENOUS

## 2013-04-10 MED ORDER — PHENOL 1.4 % MT LIQD: 1.0000 | OROMUCOSAL | Status: DC | PRN

## 2013-04-10 MED ORDER — BENAZEPRIL HCL 20 MG PO TABS
20.0000 mg | ORAL_TABLET | Freq: Every day | ORAL | Status: DC
  Administered 2013-04-10 – 2013-04-13 (×4): 20 mg via ORAL
  Filled 2013-04-10 (×4): qty 1

## 2013-04-10 MED ORDER — PANTOPRAZOLE SODIUM 40 MG PO TBEC
40.0000 mg | DELAYED_RELEASE_TABLET | Freq: Every day | ORAL | Status: DC
  Administered 2013-04-11 – 2013-04-13 (×3): 40 mg via ORAL
  Filled 2013-04-10 (×3): qty 1

## 2013-04-10 MED ORDER — ACETAMINOPHEN 325 MG PO TABS: 650.0000 mg | ORAL_TABLET | Freq: Four times a day (QID) | ORAL | Status: DC | PRN

## 2013-04-10 MED ORDER — CEFAZOLIN SODIUM-DEXTROSE 2-3 GM-% IV SOLR
INTRAVENOUS | Status: AC
  Filled 2013-04-10: qty 50

## 2013-04-10 MED ORDER — BUPIVACAINE-EPINEPHRINE 0.5% -1:200000 IJ SOLN
INTRAMUSCULAR | Status: DC | PRN
  Administered 2013-04-10: 10 mL

## 2013-04-10 MED ORDER — LACTATED RINGERS IV SOLN
INTRAVENOUS | Status: DC
  Administered 2013-04-10: 1000 mL via INTRAVENOUS

## 2013-04-10 MED ORDER — SODIUM CHLORIDE 0.9 % IR SOLN
Status: DC | PRN
  Administered 2013-04-10: 12:00:00

## 2013-04-10 MED ORDER — VITAMIN B-12 100 MCG PO TABS
100.0000 ug | ORAL_TABLET | Freq: Every day | ORAL | Status: DC
  Administered 2013-04-10 – 2013-04-13 (×4): 100 ug via ORAL
  Filled 2013-04-10 (×4): qty 1

## 2013-04-10 MED ORDER — ALPRAZOLAM 0.25 MG PO TABS
0.2500 mg | ORAL_TABLET | Freq: Four times a day (QID) | ORAL | Status: DC | PRN
  Administered 2013-04-10 – 2013-04-13 (×8): 0.25 mg via ORAL
  Filled 2013-04-10 (×9): qty 1

## 2013-04-10 MED ORDER — CLINDAMYCIN PHOSPHATE 900 MG/50ML IV SOLN
900.0000 mg | INTRAVENOUS | Status: AC
  Administered 2013-04-10: 900 mg via INTRAVENOUS
  Filled 2013-04-10: qty 50

## 2013-04-10 MED ORDER — LABETALOL HCL 5 MG/ML IV SOLN
INTRAVENOUS | Status: DC | PRN
  Administered 2013-04-10 (×2): 2.5 mg via INTRAVENOUS

## 2013-04-10 MED ORDER — CLINDAMYCIN PHOSPHATE 900 MG/50ML IV SOLN
INTRAVENOUS | Status: AC
  Filled 2013-04-10: qty 50

## 2013-04-10 MED ORDER — DEXAMETHASONE SODIUM PHOSPHATE 10 MG/ML IJ SOLN
INTRAMUSCULAR | Status: DC | PRN
  Administered 2013-04-10: 10 mg via INTRAVENOUS

## 2013-04-10 MED ORDER — MUPIROCIN 2 % EX OINT: TOPICAL_OINTMENT | Freq: Two times a day (BID) | CUTANEOUS | Status: DC

## 2013-04-10 MED ORDER — MENTHOL 3 MG MT LOZG: 1.0000 | LOZENGE | OROMUCOSAL | Status: DC | PRN

## 2013-04-10 MED ORDER — GLYCOPYRROLATE 0.2 MG/ML IJ SOLN
INTRAMUSCULAR | Status: DC | PRN
  Administered 2013-04-10: 0.4 mg via INTRAVENOUS

## 2013-04-10 MED ORDER — LORATADINE 10 MG PO TABS: 10.0000 mg | ORAL_TABLET | Freq: Every day | ORAL | Status: DC

## 2013-04-10 MED ORDER — DULOXETINE HCL 60 MG PO CPEP
60.0000 mg | ORAL_CAPSULE | Freq: Every morning | ORAL | Status: DC
  Administered 2013-04-11 – 2013-04-13 (×3): 60 mg via ORAL
  Filled 2013-04-10 (×3): qty 1

## 2013-04-10 MED ORDER — RIVAROXABAN 10 MG PO TABS
10.0000 mg | ORAL_TABLET | Freq: Every day | ORAL | Status: DC
  Administered 2013-04-11 – 2013-04-13 (×3): 10 mg via ORAL
  Filled 2013-04-10 (×4): qty 1

## 2013-04-10 MED ORDER — ACETAMINOPHEN 10 MG/ML IV SOLN
1000.0000 mg | INTRAVENOUS | Status: AC
  Administered 2013-04-10: 1000 mg via INTRAVENOUS
  Filled 2013-04-10 (×2): qty 100

## 2013-04-10 MED ORDER — LINACLOTIDE 290 MCG PO CAPS
290.0000 ug | ORAL_CAPSULE | Freq: Every day | ORAL | Status: DC
  Administered 2013-04-10 – 2013-04-13 (×4): 290 ug via ORAL
  Filled 2013-04-10 (×4): qty 1

## 2013-04-10 MED ORDER — PROPOFOL 10 MG/ML IV BOLUS
INTRAVENOUS | Status: DC | PRN
  Administered 2013-04-10: 180 mg via INTRAVENOUS

## 2013-04-10 MED ORDER — FENTANYL CITRATE 0.05 MG/ML IJ SOLN
INTRAMUSCULAR | Status: DC | PRN
  Administered 2013-04-10 (×2): 100 ug via INTRAVENOUS
  Administered 2013-04-10: 50 ug via INTRAVENOUS
  Administered 2013-04-10: 100 ug via INTRAVENOUS

## 2013-04-10 MED ORDER — METHOCARBAMOL 500 MG PO TABS
500.0000 mg | ORAL_TABLET | Freq: Four times a day (QID) | ORAL | Status: DC | PRN
  Administered 2013-04-10 – 2013-04-13 (×8): 500 mg via ORAL
  Filled 2013-04-10 (×9): qty 1

## 2013-04-10 MED ORDER — SODIUM CHLORIDE 0.45 % IV SOLN
INTRAVENOUS | Status: AC
  Administered 2013-04-10: 16:00:00 1000 mL via INTRAVENOUS

## 2013-04-10 MED ORDER — NEOSTIGMINE METHYLSULFATE 1 MG/ML IJ SOLN
INTRAMUSCULAR | Status: DC | PRN
  Administered 2013-04-10: 3 mg via INTRAVENOUS

## 2013-04-10 MED ORDER — FOLIC ACID 1 MG PO TABS
1.0000 mg | ORAL_TABLET | Freq: Every day | ORAL | Status: DC
  Administered 2013-04-10 – 2013-04-13 (×4): 1 mg via ORAL
  Filled 2013-04-10 (×4): qty 1

## 2013-04-10 MED ORDER — MIDAZOLAM HCL 5 MG/5ML IJ SOLN
INTRAMUSCULAR | Status: DC | PRN
  Administered 2013-04-10: 2 mg via INTRAVENOUS

## 2013-04-10 MED ORDER — BUPIVACAINE-EPINEPHRINE 0.5% -1:200000 IJ SOLN
INTRAMUSCULAR | Status: AC
  Filled 2013-04-10: qty 1

## 2013-04-10 MED ORDER — FUROSEMIDE 40 MG PO TABS: 40.0000 mg | ORAL_TABLET | Freq: Two times a day (BID) | ORAL | Status: DC

## 2013-04-10 MED ORDER — ONDANSETRON HCL 4 MG/2ML IJ SOLN
INTRAMUSCULAR | Status: DC | PRN
  Administered 2013-04-10: 4 mg via INTRAVENOUS

## 2013-04-10 MED ORDER — POTASSIUM CHLORIDE CRYS ER 20 MEQ PO TBCR: 20.0000 meq | EXTENDED_RELEASE_TABLET | Freq: Every day | ORAL | Status: DC

## 2013-04-10 MED ORDER — METHOCARBAMOL 100 MG/ML IJ SOLN
500.0000 mg | Freq: Four times a day (QID) | INTRAVENOUS | Status: DC | PRN
  Administered 2013-04-10: 500 mg via INTRAVENOUS
  Filled 2013-04-10 (×2): qty 5

## 2013-04-10 MED ORDER — METOCLOPRAMIDE HCL 10 MG PO TABS: 5.0000 mg | ORAL_TABLET | Freq: Three times a day (TID) | ORAL | Status: DC | PRN

## 2013-04-10 MED ORDER — MUPIROCIN 2 % EX OINT
TOPICAL_OINTMENT | Freq: Two times a day (BID) | CUTANEOUS | Status: DC
  Administered 2013-04-10: 1 via NASAL
  Filled 2013-04-10: qty 22

## 2013-04-10 MED ORDER — LIDOCAINE HCL (CARDIAC) 20 MG/ML IV SOLN
INTRAVENOUS | Status: DC | PRN
  Administered 2013-04-10: 50 mg via INTRAVENOUS

## 2013-04-10 MED ORDER — CEFAZOLIN SODIUM-DEXTROSE 2-3 GM-% IV SOLR
2.0000 g | INTRAVENOUS | Status: AC
  Administered 2013-04-10: 2 g via INTRAVENOUS

## 2013-04-10 MED ORDER — BUPIVACAINE LIPOSOME 1.3 % IJ SUSP
INTRAMUSCULAR | Status: DC | PRN
  Administered 2013-04-10: 20 mL

## 2013-04-10 MED ORDER — ACETAMINOPHEN 650 MG RE SUPP: 650.0000 mg | Freq: Four times a day (QID) | RECTAL | Status: DC | PRN

## 2013-04-10 MED ORDER — TIZANIDINE HCL 4 MG PO TABS
4.0000 mg | ORAL_TABLET | Freq: Three times a day (TID) | ORAL | Status: DC
  Administered 2013-04-10 – 2013-04-13 (×8): 4 mg via ORAL
  Filled 2013-04-10 (×11): qty 1

## 2013-04-10 MED ORDER — CEFAZOLIN SODIUM-DEXTROSE 2-3 GM-% IV SOLR
2.0000 g | Freq: Four times a day (QID) | INTRAVENOUS | Status: AC
  Administered 2013-04-10 (×2): 2 g via INTRAVENOUS
  Filled 2013-04-10 (×2): qty 50

## 2013-04-10 MED ORDER — AMLODIPINE BESY-BENAZEPRIL HCL 5-20 MG PO CAPS: 1.0000 | ORAL_CAPSULE | Freq: Every morning | ORAL | Status: DC

## 2013-04-10 MED ORDER — ONDANSETRON HCL 4 MG/2ML IJ SOLN: 4.0000 mg | Freq: Four times a day (QID) | INTRAMUSCULAR | Status: DC | PRN

## 2013-04-10 MED ORDER — HYDROMORPHONE HCL PF 1 MG/ML IJ SOLN
0.2500 mg | INTRAMUSCULAR | Status: DC | PRN
  Administered 2013-04-10: 0.5 mg via INTRAVENOUS
  Administered 2013-04-10: 0.25 mg via INTRAVENOUS
  Administered 2013-04-10 (×2): 0.5 mg via INTRAVENOUS
  Administered 2013-04-10: 0.25 mg via INTRAVENOUS

## 2013-04-10 MED ORDER — ROCURONIUM BROMIDE 100 MG/10ML IV SOLN
INTRAVENOUS | Status: DC | PRN
  Administered 2013-04-10: 40 mg via INTRAVENOUS

## 2013-04-10 MED ORDER — OXYCODONE HCL 5 MG PO TABS
20.0000 mg | ORAL_TABLET | ORAL | Status: DC
  Administered 2013-04-10 – 2013-04-11 (×3): 20 mg via ORAL
  Filled 2013-04-10 (×2): qty 4

## 2013-04-10 MED ORDER — PROMETHAZINE HCL 25 MG/ML IJ SOLN: 6.2500 mg | INTRAMUSCULAR | Status: DC | PRN

## 2013-04-10 MED ORDER — HYDROMORPHONE HCL PF 1 MG/ML IJ SOLN
1.0000 mg | INTRAMUSCULAR | Status: DC | PRN
  Administered 2013-04-10 – 2013-04-11 (×5): 1 mg via INTRAVENOUS
  Filled 2013-04-10 (×6): qty 1

## 2013-04-10 MED ORDER — CLINDAMYCIN PHOSPHATE 900 MG/50ML IV SOLN
900.0000 mg | Freq: Four times a day (QID) | INTRAVENOUS | Status: AC
  Administered 2013-04-10 – 2013-04-11 (×3): 900 mg via INTRAVENOUS
  Filled 2013-04-10 (×3): qty 50

## 2013-04-10 MED ORDER — DOCUSATE SODIUM 100 MG PO CAPS
100.0000 mg | ORAL_CAPSULE | Freq: Two times a day (BID) | ORAL | Status: DC
  Administered 2013-04-10 – 2013-04-13 (×6): 100 mg via ORAL

## 2013-04-10 MED ORDER — ALIGN PO CAPS
1.0000 | ORAL_CAPSULE | Freq: Every day | ORAL | Status: DC
  Administered 2013-04-10 – 2013-04-13 (×4): 1 via ORAL
  Filled 2013-04-10 (×4): qty 1

## 2013-04-10 MED ORDER — CLONAZEPAM 0.5 MG PO TABS: 0.5000 mg | ORAL_TABLET | Freq: Every evening | ORAL | Status: DC | PRN

## 2013-04-10 MED ORDER — BUPIVACAINE LIPOSOME 1.3 % IJ SUSP
20.0000 mL | Freq: Once | INTRAMUSCULAR | Status: DC
  Filled 2013-04-10: qty 20

## 2013-04-10 MED ORDER — METOCLOPRAMIDE HCL 5 MG/ML IJ SOLN: 5.0000 mg | Freq: Three times a day (TID) | INTRAMUSCULAR | Status: DC | PRN

## 2013-04-10 MED ORDER — AMLODIPINE BESYLATE 5 MG PO TABS
5.0000 mg | ORAL_TABLET | Freq: Every day | ORAL | Status: DC
  Administered 2013-04-10 – 2013-04-13 (×4): 5 mg via ORAL
  Filled 2013-04-10 (×4): qty 1

## 2013-04-10 MED ORDER — RIVAROXABAN 10 MG PO TABS: 10.0000 mg | ORAL_TABLET | Freq: Every day | ORAL | Status: DC

## 2013-04-10 SURGICAL SUPPLY — 69 items
BAG ZIPLOCK 12X15 (MISCELLANEOUS) ×2 IMPLANT
BANDAGE ELASTIC 4 VELCRO ST LF (GAUZE/BANDAGES/DRESSINGS) ×2 IMPLANT
BANDAGE ELASTIC 6 VELCRO ST LF (GAUZE/BANDAGES/DRESSINGS) ×2 IMPLANT
BANDAGE ESMARK 6X9 LF (GAUZE/BANDAGES/DRESSINGS) ×1 IMPLANT
BLADE SAG 18X100X1.27 (BLADE) ×2 IMPLANT
BLADE SAW SGTL 13.0X1.19X90.0M (BLADE) ×2 IMPLANT
BNDG ESMARK 6X9 LF (GAUZE/BANDAGES/DRESSINGS) ×2
CAPT RP KNEE ×2 IMPLANT
CEMENT HV SMART SET (Cement) ×4 IMPLANT
CHLORAPREP W/TINT 26ML (MISCELLANEOUS) IMPLANT
CLOTH 2% CHLOROHEXIDINE 3PK (PERSONAL CARE ITEMS) ×2 IMPLANT
CLOTH BEACON ORANGE TIMEOUT ST (SAFETY) ×2 IMPLANT
CUFF TOURN SGL QUICK 34 (TOURNIQUET CUFF) ×1
CUFF TRNQT CYL 34X4X40X1 (TOURNIQUET CUFF) ×1 IMPLANT
DECANTER SPIKE VIAL GLASS SM (MISCELLANEOUS) ×2 IMPLANT
DERMABOND ADVANCED (GAUZE/BANDAGES/DRESSINGS)
DERMABOND ADVANCED .7 DNX12 (GAUZE/BANDAGES/DRESSINGS) IMPLANT
DRAPE LG THREE QUARTER DISP (DRAPES) ×2 IMPLANT
DRAPE ORTHO SPLIT 77X108 STRL (DRAPES) ×2
DRAPE POUCH INSTRU U-SHP 10X18 (DRAPES) ×2 IMPLANT
DRAPE SURG ORHT 6 SPLT 77X108 (DRAPES) ×2 IMPLANT
DRAPE U-SHAPE 47X51 STRL (DRAPES) ×2 IMPLANT
DRSG ADAPTIC 3X8 NADH LF (GAUZE/BANDAGES/DRESSINGS) ×2 IMPLANT
DRSG AQUACEL AG ADV 3.5X10 (GAUZE/BANDAGES/DRESSINGS) ×2 IMPLANT
DRSG PAD ABDOMINAL 8X10 ST (GAUZE/BANDAGES/DRESSINGS) ×2 IMPLANT
DRSG TEGADERM 4X4.75 (GAUZE/BANDAGES/DRESSINGS) ×2 IMPLANT
DURAPREP 26ML APPLICATOR (WOUND CARE) ×2 IMPLANT
ELECT REM PT RETURN 9FT ADLT (ELECTROSURGICAL) ×2
ELECTRODE REM PT RTRN 9FT ADLT (ELECTROSURGICAL) ×1 IMPLANT
EVACUATOR 1/8 PVC DRAIN (DRAIN) ×2 IMPLANT
FACESHIELD LNG OPTICON STERILE (SAFETY) ×10 IMPLANT
GAUZE SPONGE 2X2 8PLY STRL LF (GAUZE/BANDAGES/DRESSINGS) ×1 IMPLANT
GLOVE BIOGEL PI IND STRL 7.5 (GLOVE) ×1 IMPLANT
GLOVE BIOGEL PI IND STRL 8 (GLOVE) ×1 IMPLANT
GLOVE BIOGEL PI INDICATOR 7.5 (GLOVE) ×1
GLOVE BIOGEL PI INDICATOR 8 (GLOVE) ×1
GLOVE SURG SS PI 7.5 STRL IVOR (GLOVE) ×2 IMPLANT
GLOVE SURG SS PI 8.0 STRL IVOR (GLOVE) ×4 IMPLANT
GOWN STRL REIN XL XLG (GOWN DISPOSABLE) ×4 IMPLANT
HANDPIECE INTERPULSE COAX TIP (DISPOSABLE) ×1
IMMOBILIZER KNEE 20 (SOFTGOODS) ×2
IMMOBILIZER KNEE 20 THIGH 36 (SOFTGOODS) ×1 IMPLANT
KIT BASIN OR (CUSTOM PROCEDURE TRAY) ×2 IMPLANT
MANIFOLD NEPTUNE II (INSTRUMENTS) ×2 IMPLANT
NEEDLE HYPO 22GX1.5 SAFETY (NEEDLE) ×4 IMPLANT
NS IRRIG 1000ML POUR BTL (IV SOLUTION) ×2 IMPLANT
PACK TOTAL JOINT (CUSTOM PROCEDURE TRAY) ×2 IMPLANT
PADDING CAST COTTON 6X4 STRL (CAST SUPPLIES) ×2 IMPLANT
POSITIONER SURGICAL ARM (MISCELLANEOUS) ×2 IMPLANT
SET HNDPC FAN SPRY TIP SCT (DISPOSABLE) ×1 IMPLANT
SPONGE GAUZE 2X2 STER 10/PKG (GAUZE/BANDAGES/DRESSINGS) ×1
SPONGE GAUZE 4X4 12PLY (GAUZE/BANDAGES/DRESSINGS) IMPLANT
SPONGE SURGIFOAM ABS GEL 100 (HEMOSTASIS) IMPLANT
STAPLER VISISTAT (STAPLE) ×2 IMPLANT
STRIP CLOSURE SKIN 1/4X4 (GAUZE/BANDAGES/DRESSINGS) ×4 IMPLANT
SUCTION FRAZIER 12FR DISP (SUCTIONS) ×2 IMPLANT
SUT BONE WAX W31G (SUTURE) ×2 IMPLANT
SUT MNCRL AB 4-0 PS2 18 (SUTURE) IMPLANT
SUT VIC AB 1 CT1 27 (SUTURE) ×4
SUT VIC AB 1 CT1 27XBRD ANTBC (SUTURE) ×4 IMPLANT
SUT VIC AB 2-0 CT1 27 (SUTURE) ×3
SUT VIC AB 2-0 CT1 TAPERPNT 27 (SUTURE) ×3 IMPLANT
SUT VLOC 180 0 24IN GS25 (SUTURE) ×2 IMPLANT
SYR 20CC LL (SYRINGE) ×4 IMPLANT
TOWEL OR 17X26 10 PK STRL BLUE (TOWEL DISPOSABLE) ×2 IMPLANT
TOWER CARTRIDGE SMART MIX (DISPOSABLE) ×2 IMPLANT
TRAY FOLEY CATH 14FRSI W/METER (CATHETERS) ×2 IMPLANT
WATER STERILE IRR 1500ML POUR (IV SOLUTION) ×2 IMPLANT
WRAP KNEE MAXI GEL POST OP (GAUZE/BANDAGES/DRESSINGS) ×2 IMPLANT

## 2013-04-10 NOTE — Interval H&P Note (Signed)
History and Physical Interval Note:  04/10/2013 7:26 AM  Jessica Lopez  has presented today for surgery, with the diagnosis of LEFT KNEE DJD  The various methods of treatment have been discussed with the patient and family. After consideration of risks, benefits and other options for treatment, the patient has consented to  Procedure(s): LEFT TOTAL KNEE ARTHROPLASTY (Left) as a surgical intervention .  The patient's history has been reviewed, patient examined, no change in status, stable for surgery.  I have reviewed the patient's chart and labs.  Questions were answered to the patient's satisfaction.     Kymani Laursen C

## 2013-04-10 NOTE — H&P (View-Only) (Signed)
Jessica Lopez DOB: 04/13/1941 Female  H&P date: 04/02/13  Chief complaint: Left knee pain  History of Present Illness The patient is a 72 year old female who comes in today for a preoperative History and Physical. The patient is scheduled for a left total knee arthroplasty to be performed by Dr. Jeffrey C. Beane, MD at  Hospital on April 10, 2013. She reports L knee pain for many years, progressively worsening. Refractory to steroid injections, bracing, activity modification, relative rest, opioid pain medications, NSAIDs, quad strengthening. Dr. Beane and the patient mutually agreed to proceed with a total knee replacement. Risks and benefits of the procedure were discussed including stiffness, suboptimal range of motion, persistent pain, infection requiring removal of prosthesis and reinsertion, need for prophylactic antibiotics in the future, for example, dental procedures, possible need for manipulation, revision in the future and also anesthetic complications including DVT, PE, etc. We discussed the perioperative course, time in the hospital, postoperative recovery and the need for elevation to control swelling. We also discussed the predicted range of motion and the probability that squatting and kneeling would be unobtainable in the future. In addition, postoperative anticoagulation was discussed. We will obtain preoperative medical clearance if necessary. Provided her illustrated handout and discussed it in detail. They will enroll in the total joint replacement educational forum at the hospital. She has been cleared by Dr. Shah for surgery.  Past Medical Hx Anxiety Disorder Congestive Heart Failure Coronary artery disease Gastroesophageal Reflux Disease High blood pressure Hypercholesterolemia Osteoporosis Rheumatoid Arthritis  Allergies Hydrocodone/Acetaminophen *ANALGESICS - OPIOID*. itching at high doses  Family History Cancer. sister Diabetes  Mellitus. child  Social History Alcohol use. current drinker; drinks wine; only occasionally per week Children. 5 or more Current work status. retired Drug/Alcohol Rehab (Currently). no Drug/Alcohol Rehab (Previously). no Exercise. Exercises never Illicit drug use. no Living situation. live with spouse Marital status. married Number of flights of stairs before winded. less than 1 Pain Contract. yes Tobacco / smoke exposure. no Tobacco use. former smoker; smoke(d) less than 1/2 pack(s) per day Post-Surgical Plans. rehab  Medication History OxyCODONE HCl (20MG Tablet, Oral) Active. ALPRAZolam ( Oral) Specific dose unknown - Active. Amlodipine Besy-Benazepril HCl (10-20MG Capsule, Oral) Active. Cyclobenzaprine HCl (10MG Tablet, Oral) Active. Furosemide (40MG Tablet, Oral) Active. Klor-Con M20 (20MEQ Tablet ER, Oral) Active. NexIUM (40MG Capsule DR, Oral) Active. FLUoxetine HCl (20MG Capsule, Oral) Active. Folic Acid (1MG Tablet, Oral) Active. Amitiza (24MCG Capsule, Oral) Active. Vitamin D3 (1000UNIT Tablet, Oral) Active. Omega 3 ( Oral) Specific dose unknown - Active. Oxycodone HCl (20MG Tablet ER, Oral) Active. Ibuprofen (800MG Tablet, Oral) Active. TiZANidine HCl (4MG Tablet, Oral) Active.  Pregnancy / Birth History Pregnant. no  Past Surgical History Hysterectomy. complete (non-cancerous) Total Knee Replacement. right  Review of Systems General:Present- Night Sweats and Fatigue. Not Present- Chills, Fever, Weight Gain, Weight Loss and Memory Loss. Skin:Not Present- Hives, Itching, Rash, Eczema and Lesions. HEENT:Not Present- Tinnitus, Headache, Double Vision, Visual Loss, Hearing Loss and Dentures. Respiratory:Not Present- Shortness of breath with exertion, Shortness of breath at rest, Allergies, Coughing up blood and Chronic Cough. Cardiovascular:Present- Swelling. Not Present- Chest Pain, Racing/skipping heartbeats, Difficulty Breathing  Lying Down, Murmur and Palpitations. Gastrointestinal:Present- Heartburn and Constipation. Not Present- Bloody Stool, Abdominal Pain, Vomiting, Nausea, Diarrhea, Difficulty Swallowing, Jaundice and Loss of appetitie. Female Genitourinary:Present- Blood in Urine. Not Present- Urinary frequency, Weak urinary stream, Discharge, Flank Pain, Incontinence, Painful Urination, Urgency, Urinary Retention and Urinating at Night. Musculoskeletal:Present- Joint Swelling and Back Pain. Not   Present- Muscle Weakness, Muscle Pain, Joint Pain, Morning Stiffness and Spasms. Neurological:Present- Difficulty with balance. Not Present- Tremor, Dizziness, Blackout spells, Paralysis and Weakness. Psychiatric:Not Present- Insomnia.  Physical Exam The physical exam findings are as follows:  General Mental Status - Alert, cooperative and good historian. General Appearance- pleasant. Not in acute distress. Orientation- Oriented X3. Build & Nutrition- Well nourished and Well developed.  Head and Neck Head- normocephalic, atraumatic . Neck Global Assessment- supple. no bruit auscultated on the right and no bruit auscultated on the left.  Eye Pupil- Bilateral- PERRLA. Motion- Bilateral- EOMI.  Chest and Lung Exam Auscultation: Breath sounds:- clear at anterior chest wall and - clear at posterior chest wall. Adventitious sounds:- No Adventitious sounds.  Cardiovascular Auscultation:Rhythm- Regular rate and rhythm. Heart Sounds- S1 WNL and S2 WNL. Murmurs & Other Heart Sounds:Auscultation of the heart reveals - No Murmurs.  Abdomen Palpation/Percussion:Tenderness- Abdomen is non-tender to palpation. Rigidity (guarding)- Abdomen is soft. Auscultation:Auscultation of the abdomen reveals - Bowel sounds normal.  Female Genitourinary Not done, not pertinent to present illness  Musculoskeletal Examination of the left knee, she has some valgus deformity. She is tender in the medial  joint line, tender in the lateral joint line. Patellofemoral pain with compression. No evidence of infection. No evidence of soft tissue swelling, ecchymosis, deformity or erythema. Nontender over the fibular head or the peroneal nerve. Nontender over the quadriceps insertion of the patellar ligament insertion. The range of motion was full. Provocative maneuvers revealed a negative Lachman's, negative anterior and posterior drawer, and a negative McMurray's. On manual motor test, the quadriceps and hamstrings were five over five. Sensory exam was intact to light touch. Antalgic gait, use of a cane.  Imaging X-rays of the left knee demonstrate bone-on-bone arthrosis lateral compartment, patellofemoral arthrosis.  Assessment & Plan DJD Left knee  Pt scheduled for L TKA on 04/10/13 by Dr. Beane. Discussed surgery itself as well as risks, complications, and alternatives including but not limited to DVT, PE, infx, bleeding, failure of procedure, need for secondary procedure, complex regional pain syndrome, scarring, need for manipulation, anesthesia risk, even death. Discussed typical post-op course, protocols, hospital stay, expected outcome, need for PT and HEP, working on regaining full extension and flexion to at least 110 degrees for a functional knee. She understands and desires to proceed. Plan for rehab after D/C from hospital. Plan Xarelto for DVT ppx. She will present to [facility] for pre-op appt 9/8 as scheduled. Remain NPO after MN. Hold NSAIDs, vitamins, and supplements accordingly. She will continue her pain mgmt medication in the interim. She will will up 10-14 days after surgery for staple removal and xrays.  Plan left total knee replacement  Signed electronically by Jaclyn M Bissell, PA-C for Dr. Beane  

## 2013-04-10 NOTE — Preoperative (Signed)
Beta Blockers   Reason not to administer Beta Blockers:Not Applicable 

## 2013-04-10 NOTE — Anesthesia Preprocedure Evaluation (Addendum)
Anesthesia Evaluation  Patient identified by MRN, date of birth, ID band Patient awake    Reviewed: Allergy & Precautions, H&P , NPO status , Patient's Chart, lab work & pertinent test results  Airway Mallampati: II TM Distance: <3 FB Neck ROM: Full    Dental no notable dental hx.    Pulmonary neg pulmonary ROS,  breath sounds clear to auscultation  Pulmonary exam normal       Cardiovascular hypertension, Pt. on medications Rhythm:Regular Rate:Normal     Neuro/Psych negative neurological ROS  negative psych ROS   GI/Hepatic Neg liver ROS, GERD-  Medicated,  Endo/Other  Morbid obesity  Renal/GU negative Renal ROS  negative genitourinary   Musculoskeletal negative musculoskeletal ROS (+)   Abdominal   Peds negative pediatric ROS (+)  Hematology negative hematology ROS (+)   Anesthesia Other Findings   Reproductive/Obstetrics negative OB ROS                          Anesthesia Physical Anesthesia Plan  ASA: II  Anesthesia Plan: General   Post-op Pain Management:    Induction: Intravenous  Airway Management Planned: Oral ETT  Additional Equipment:   Intra-op Plan:   Post-operative Plan: Extubation in OR  Informed Consent: I have reviewed the patients History and Physical, chart, labs and discussed the procedure including the risks, benefits and alternatives for the proposed anesthesia with the patient or authorized representative who has indicated his/her understanding and acceptance.   Dental advisory given  Plan Discussed with: CRNA and Surgeon  Anesthesia Plan Comments:         Anesthesia Quick Evaluation

## 2013-04-10 NOTE — Progress Notes (Signed)
Utilization review completed.  

## 2013-04-10 NOTE — Progress Notes (Signed)
Patients daughter states that PST nurse did notify the patient that she had a positive PCR screen but when she called Dr. Veronda Prude office they said she would be given antibiotics IV pre and post op to take care of the Staph germ. Therefor the Mupirocin ointment was not started until the morning of surgery in short stay

## 2013-04-10 NOTE — Transfer of Care (Signed)
Immediate Anesthesia Transfer of Care Note  Patient: Jessica Lopez  Procedure(s) Performed: Procedure(s) (LRB): LEFT TOTAL KNEE ARTHROPLASTY (Left)  Patient Location: PACU  Anesthesia Type: General  Level of Consciousness: sedated, patient cooperative and responds to stimulaton  Airway & Oxygen Therapy: Patient Spontanous Breathing and Patient connected to face mask oxgen  Post-op Assessment: Report given to PACU RN and Post -op Vital signs reviewed and stable  Post vital signs: Reviewed and stable  Complications: No apparent anesthesia complications

## 2013-04-10 NOTE — Anesthesia Postprocedure Evaluation (Signed)
  Anesthesia Post-op Note  Patient: Jessica Lopez  Procedure(s) Performed: Procedure(s) (LRB): LEFT TOTAL KNEE ARTHROPLASTY (Left)  Patient Location: PACU  Anesthesia Type: General  Level of Consciousness: awake and alert   Airway and Oxygen Therapy: Patient Spontanous Breathing  Post-op Pain: mild  Post-op Assessment: Post-op Vital signs reviewed, Patient's Cardiovascular Status Stable, Respiratory Function Stable, Patent Airway and No signs of Nausea or vomiting  Last Vitals:  Filed Vitals:   04/10/13 1243  BP: 153/85  Pulse:   Temp: 36.7 C  Resp: 13    Post-op Vital Signs: stable   Complications: No apparent anesthesia complications

## 2013-04-10 NOTE — Brief Op Note (Signed)
04/10/2013  12:32 PM  PATIENT:  Jessica Lopez  71 y.o. female  PRE-OPERATIVE DIAGNOSIS:  LEFT KNEE DJD  POST-OPERATIVE DIAGNOSIS:  LEFT KNEE DJD  PROCEDURE:  Procedure(s): LEFT TOTAL KNEE ARTHROPLASTY (Left)  SURGEON:  Surgeon(s) and Role:    * Javier Docker, MD - Primary  PHYSICIAN ASSISTANT:   ASSISTANTS: Bissell   ANESTHESIA:   general  EBL:  Total I/O In: 1000 [I.V.:1000] Out: 325 [Urine:300; Blood:25]  BLOOD ADMINISTERED:none  DRAINS: none Hemovac  LOCAL MEDICATIONS USED:  MARCAINE     SPECIMEN:  No Specimen  DISPOSITION OF SPECIMEN:  N/A  COUNTS:  YES  TOURNIQUET:   Total Tourniquet Time Documented: Thigh (Left) - 2 minutes Thigh (Left) - 87 minutes Total: Thigh (Left) - 89 minutes   DICTATION: .Reubin Milan Dictation [phone]  PLAN OF CARE: Admit to inpatient   PATIENT DISPOSITION:  PACU - hemodynamically stable.   Delay start of Pharmacological VTE agent (>24hrs) due to surgical blood loss or risk of bleeding: yes

## 2013-04-11 ENCOUNTER — Encounter (HOSPITAL_COMMUNITY): Payer: Self-pay | Admitting: Specialist

## 2013-04-11 LAB — BASIC METABOLIC PANEL
BUN: 11 mg/dL (ref 6–23)
Chloride: 100 mEq/L (ref 96–112)
GFR calc Af Amer: 73 mL/min — ABNORMAL LOW (ref >90)
GFR calc non Af Amer: 63 mL/min — ABNORMAL LOW (ref >90)
Potassium: 4.4 mEq/L (ref 3.5–5.1)

## 2013-04-11 LAB — CBC
HCT: 33.4 % — ABNORMAL LOW (ref 36.0–46.0)
MCHC: 33.2 g/dL (ref 30.0–36.0)
Platelets: 220 10*3/uL (ref 150–400)
RDW: 13.5 % (ref 11.5–15.5)
WBC: 13.9 10*3/uL — ABNORMAL HIGH (ref 4.0–10.5)

## 2013-04-11 MED ORDER — HYDROMORPHONE HCL 2 MG PO TABS: 2.0000 mg | ORAL_TABLET | ORAL | Status: DC | PRN

## 2013-04-11 MED ORDER — HYDROMORPHONE HCL 2 MG PO TABS
2.0000 mg | ORAL_TABLET | ORAL | Status: DC | PRN
  Administered 2013-04-11 (×3): 2 mg via ORAL
  Filled 2013-04-11 (×3): qty 1

## 2013-04-11 MED ORDER — OXYCODONE HCL 5 MG PO TABS
20.0000 mg | ORAL_TABLET | ORAL | Status: DC | PRN
  Administered 2013-04-11 – 2013-04-13 (×10): 20 mg via ORAL
  Filled 2013-04-11 (×11): qty 4

## 2013-04-11 MED ORDER — HYDROMORPHONE HCL 2 MG PO TABS
2.0000 mg | ORAL_TABLET | ORAL | Status: DC | PRN
  Administered 2013-04-11 – 2013-04-13 (×6): 4 mg via ORAL
  Filled 2013-04-11 (×6): qty 2

## 2013-04-11 NOTE — Progress Notes (Signed)
Patient ID: Jessica Lopez, female   DOB: [birth year], 72 y.o.   MRN: [id number] Subjective: 1 Day Post-Op Procedure(s) (LRB): LEFT TOTAL KNEE ARTHROPLASTY (Left) Patient reports pain as moderate and severe.  Had trouble sleeping overnight due to pain. She is on chronic pain meds (oxycodone 20mg ) which has been ordered and is receiving dilaudid for breakthrough pain. Denies N/V. No other complaints this AM. Her daughter stayed overnight with her.  Patient has complaints of pain left knee and leg. Appears comfortable.  We will start therapy today. Plan is to go to SNF/rehab facility after hospital stay.  Objective: Vital signs in last 24 hours: Temp:  [98.1 F (36.7 C)-99.5 F (37.5 C)] 98.2 F (36.8 C) (09/12 0547) Pulse Rate:  [84-125] 99 (09/12 0547) Resp:  [7-18] 16 (09/12 0547) BP: (115-173)/(69-94) 130/77 mmHg (09/12 0547) SpO2:  [94 %-99 %] 98 % (09/12 0547) Weight:  [103 kg (227 lb 1.2 oz)] 103 kg (227 lb 1.2 oz) (09/11 1607)  Intake/Output from previous day:  Intake/Output Summary (Last 24 hours) at 04/11/13 0740 Last data filed at 04/11/13 0547  Gross per 24 hour  Intake 2682.5 ml  Output   2730 ml  Net  -47.5 ml    Intake/Output this shift:    Labs: Results for orders placed during the hospital encounter of 04/10/13  CBC      Result Value Range   WBC 13.9 (*) 4.0 - 10.5 K/uL   RBC 3.89  3.87 - 5.11 MIL/uL   Hemoglobin 11.1 (*) 12.0 - 15.0 g/dL   HCT 40.9 (*) 81.1 - 91.4 %   MCV 85.9  78.0 - 100.0 fL   MCH 28.5  26.0 - 34.0 pg   MCHC 33.2  30.0 - 36.0 g/dL   RDW 78.2  95.6 - 21.3 %   Platelets 220  150 - 400 K/uL  BASIC METABOLIC PANEL      Result Value Range   Sodium 134 (*) 135 - 145 mEq/L   Potassium 4.4  3.5 - 5.1 mEq/L   Chloride 100  96 - 112 mEq/L   CO2 27  19 - 32 mEq/L   Glucose, Bld 147 (*) 70 - 99 mg/dL   BUN 11  6 - 23 mg/dL   Creatinine, Ser 0.86  0.50 - 1.10 mg/dL   Calcium 8.9  8.4 - 57.8 mg/dL   GFR calc non Af Amer 63 (*) >90 mL/min   GFR calc Af Amer 73 (*) >90 mL/min    Exam - Neurologically intact ABD soft Neurovascular intact Sensation intact distally Intact pulses distally Dorsiflexion/Plantar flexion intact Incision: dressing C/D/I and no drainage No cellulitis present Compartment soft no sign of DVT Dressing - clean, dry, no drainage Motor function intact - moving foot and toes well on exam.  Hemovac pulled without difficulty.  Assessment/Plan: 1 Day Post-Op Procedure(s) (LRB): LEFT TOTAL KNEE ARTHROPLASTY (Left)  Advance diet Up with therapy D/C IV fluids Past Medical History  Diagnosis Date  . Osteoarthritis   . Lower back pain   . GERD (gastroesophageal reflux disease)   . Chronic constipation   . Hypertension   . Headache(784.0)     migraines  . Anxiety   . CHF (congestive heart failure)   . Anemia     DVT Prophylaxis - Xarelto Protocol Weight-Bearing as tolerated to left leg Keep foley until tomorrow. No vaccines. Plan for D/C to SNF/rehab likely Sunday if pain well controlled Will discuss with Dr. Elissa Lovett, Dayna Barker.  04/11/2013, 7:40 AM

## 2013-04-11 NOTE — Evaluation (Signed)
Occupational Therapy Evaluation Patient Details Name: Jessica Lopez MRN: [id number] DOB: Jul 04, 1941 Today's Date: 04/11/2013 Time: 4098-1191 OT Time Calculation (min): 33 min  OT Assessment / Plan / Recommendation History of present illness Pt with L TKR   Clinical Impression   Pt demos decline in function with ADLs and ADL mobility and would benefit from acute OT services to address impairments to increase level of function and safety. Pt planning to d/c to SNF for short term rehab after acute care d/c    OT Assessment  Patient needs continued OT Services    Follow Up Recommendations  SNF;Supervision/Assistance - 24 hour    Barriers to Discharge Decreased caregiver support Pt planning to d/c to SNF for rehab  Equipment Recommendations  None recommended by OT (TBD at SNF level of care)    Recommendations for Other Services    Frequency  Min 2X/week    Precautions / Restrictions Precautions Precautions: Knee Required Braces or Orthoses: Knee Immobilizer - Left Knee Immobilizer - Left: On at all times Restrictions Other Position/Activity Restrictions: WBAT   Pertinent Vitals/Pain 8/10    ADL  Grooming: Performed;Wash/dry hands;Wash/dry face;Set up Where Assessed - Grooming: Supported sitting Upper Body Bathing: Simulated;Minimal assistance Where Assessed - Upper Body Bathing: Supported sitting Lower Body Bathing: +1 Total assistance;Simulated Upper Body Dressing: Performed;Minimal assistance Where Assessed - Upper Body Dressing: Supported sitting Lower Body Dressing: +1 Total assistance Toilet Transfer: Performed;+1 Total assistance Toilet Transfer Method: Sit to stand Toilet Transfer Equipment: Raised toilet seat with arms (or 3-in-1 over toilet);Grab bars Toileting - Clothing Manipulation and Hygiene: Performed;+1 Total assistance Where Assessed - Glass blower/designer Manipulation and Hygiene: Standing Tub/Shower Transfer Method: Not assessed Equipment Used:  Long-handled shoe horn;Long-handled sponge;Reacher;Rolling walker;Knee Immobilizer;Other (comment) (3 in 1 over toilet) Transfers/Ambulation Related to ADLs: cues for safety, correct hand placement    OT Diagnosis: Acute pain;Generalized weakness  OT Problem List: Decreased strength;Decreased activity tolerance;Pain;Decreased safety awareness;Decreased knowledge of use of DME or AE OT Treatment Interventions: Self-care/ADL training;Therapeutic exercise;Patient/family education;Neuromuscular education;Balance training;Therapeutic activities;DME and/or AE instruction   OT Goals(Current goals can be found in the care plan section) Acute Rehab OT Goals Patient Stated Goal: Go to rehab then return home OT Goal Formulation: With patient/family Time For Goal Achievement: 04/18/13 Potential to Achieve Goals: Good ADL Goals Pt Will Perform Grooming: with min assist;with mod assist;standing;with caregiver independent in assisting Pt Will Perform Upper Body Bathing: with min guard assist;with supervision;with set-up;sitting Pt Will Perform Lower Body Bathing: with max assist;with caregiver independent in assisting;with adaptive equipment Pt Will Perform Upper Body Dressing: with min guard assist;with supervision;with set-up;sitting Pt Will Transfer to Toilet: with max assist;grab bars;ambulating (3 in 1 over toilet) Pt Will Perform Toileting - Clothing Manipulation and hygiene: with max assist;with mod assist;sitting/lateral leans;with caregiver independent in assisting  Visit Information  Last OT Received On: 04/11/13 Assistance Needed: +2 History of Present Illness: Pt with L TKR       Prior Functioning     Home Living Additional Comments: pals to d/c to SNF for further rehab Prior Function Level of Independence: Independent Comments: used cane, walker Communication Communication: No difficulties Dominant Hand: Left         Vision/Perception Vision - History Baseline Vision:  Wears glasses all the time Patient Visual Report: No change from baseline Perception Perception: Within Functional Limits   Cognition  Cognition Arousal/Alertness: Awake/alert Behavior During Therapy: WFL for tasks assessed/performed Overall Cognitive Status: Within Functional Limits for tasks assessed    Extremity/Trunk  Assessment Upper Extremity Assessment Upper Extremity Assessment: Overall WFL for tasks assessed;Generalized weakness     Mobility Bed Mobility Bed Mobility: Not assessed Details for Bed Mobility Assistance: Pt in recliner upon enternig room Transfers Transfers: Sit to Stand;Stand to Sit Sit to Stand: From chair/3-in-1;1: +2 Total assist Stand to Sit: To chair/3-in-1;1: +1 Total assist Details for Transfer Assistance: verbal cues for safety, correct hand placement     Exercise     Balance Balance Balance Assessed: No   End of Session OT - End of Session Equipment Utilized During Treatment: Rolling walker;Other (comment) (3 in 1, ADL A/E kit) Activity Tolerance: Patient limited by pain Patient left: Other (comment) (ambulating with PT) CPM Left Knee CPM Left Knee: Off  GO     Margaretmary Eddy [facility] 04/11/2013, 3:01 PM

## 2013-04-11 NOTE — Op Note (Signed)
Jessica Lopez, Jessica Lopez NO.:  [id number]  MEDICAL RECORD NO.:  [id number]  LOCATION:  1619                         FACILITY:  [facility]  PHYSICIAN:  Jene Every, M.D.    DATE OF BIRTH:  01-24-1941  DATE OF PROCEDURE:  04/10/2013 DATE OF DISCHARGE:                              OPERATIVE REPORT   PREOPERATIVE DIAGNOSIS:  End-stage osteoarthritis of the left knee with valgus deformity.  POSTOPERATIVE DIAGNOSIS:  End-stage osteoarthritis of the left knee with valgus deformity.  PROCEDURE PERFORMED:  Left total knee arthroplasty utilizing DePuy rotating platform, 4 femur, 4 tibia, 12.5 mm insert, 35 patella.  ANESTHESIA:  General.  ASSISTANT:  Lanna Poche, PA.  BRIEF HISTORY:  A 72 year old with end-stage osteoarthritis of the knee. The patient feels bone-on-bone, wheelchair bound, severe pain, severe limitations of activities of daily living, and is here for replacement of the degenerated unstable joint.  Risks and benefits discussed including bleeding, infection, damage to neurovascular structures, DVT, PE, anesthetic complications, suboptimal range of motion, need for manipulation and revision.  TECHNIQUE:  With the patient in supine position, after induction of adequate general anesthesia, 2 g Kefzol and 900 clindamycin, left lower extremity was prepped and draped, and exsanguinated in usual sterile fashion.  Thigh tourniquet inflated to 300 mmHg.  Midline incision was made over the patella.  Full-thickness flaps and median parapatellar arthrotomy was performed.  Patella everted and knee flexed. Tricompartmental osteoarthrosis was noted, particularly in the posterior lateral portion of the tibial plateau.  Baer rongeur utilized to remove all osteophytes.  Medial and lateral menisci remnants were removed as was the ACL.  Released partially ITB band laterally.  Step drill utilized to enter the femoral canal, 3-degree left intramedullary guide after  irrigation.  Eleven of the distal femur was utilized.  We then pinned our block, performed the distal femoral cut.  Sclerotic bone was noted laterally.  We then sized off the anterior cortex to a 4, pinned in 3 degrees of external rotation.  Performed the anterior-posterior and chamfer cuts.  Attention was turned towards the tibia.  We performed a 2 mm cut off the low side, which was posterolaterally bisecting the tibiotalar joint parallel to the tibial shaft posterior slope, pinned, and cut.  Tested our flexion extension gaps were equivalent.  This was 10.  We turned our attention towards completing the tibia.  Kales were utilized.  Tibia subluxed, maximized at a 4 just at medial aspect of tibial tubercle, pinned, drilled, punch guide applied.  We turned our attention towards completing the femur.  Box block was then placed, flushed to lateral condyle, pinned.  Osteophytes removed.  The box cut performed.  We placed drill holes in the lateral condyle which was sclerotic.  We used our trial femur and tibia and 10 mm insert.  Full extension, full flexion, good stability, varus valgus stressing 0-30 degrees.  Turned our attention towards the patella, measured at 23.  We planed it to a 14, used a 35, removed the osteophytes, drilled our PEG holes medializing the template, placed a patellar button, and we had excellent patellofemoral tracking.  All instrumentation was then removed.  We checked posteriorly, cauterized the geniculates, removed any  osteophytes and loose bodies.  Copiously irrigated with pulsatile lavage.  Flexed the knee, all surfaces dried, subluxed tibia, mixed cement on the back table in appropriate fashion, injected it into the tibial canal digitally pressurizing it, impacted 4 tray, cemented our tibia, cemented the femur, placed a 12.5 insert, reduced it, held in extension throughout the curing of the cement, cemented our patellar component as well.  After curing of the  cement, we ranged the knee.  We had full extension and flexion, good stability to varus and valgus stressing 0-30 degrees, and excellent patellofemoral tracking. Therefore, I removed the trial.  Redundant cement removed and osteotomes.  Copious irrigation was utilized, subluxed the tibia. Placed a 12.5 mm insert permanent, reduced it, full extension, full flexion, and excellent patellofemoral tracking.  Good stability with varus and valgus stressing at 0, 30 degrees.  Next, we placed a Hemovac and brought out through a lateral stab wound of the skin.  I injected Marcaine with epinephrine at the periosteum, femoral condyles and also infiltrated the musculature with Exparel 20 mL.  The patellar arthrotomy approximated with 1 Vicryl and then repaired with a running V-Loc, subcu with 2, and skin with staples.  The patient had flexion 90 degrees against gravity, full flexion, good extension, and good stability to varus and valgus stressing 0-30 degrees.  Sterile dressing applied. Tourniquet was deflated.  There was adequate revascularization of lower extremity appreciated.  Excellent correction of the valgus deformity. Extubated and transported to the recovery room in satisfactory condition.  The patient tolerated the procedure well.  No complications. Tourniquet time was 89 minutes.     Jene Every, M.D.     Cordelia Pen  D:  04/10/2013  T:  04/10/2013  Job:  [id number]

## 2013-04-11 NOTE — Progress Notes (Signed)
Physical Therapy Treatment Patient Details Name: Jessica Lopez MRN: [id number] DOB: [birth year] Today's Date: 04/11/2013 Time: 4098-1191 PT Time Calculation (min): 1408 min  PT Assessment / Plan / Recommendation  History of Present Illness Pt with L TKR   PT Comments     Follow Up Recommendations  SNF     Does the patient have the potential to tolerate intense rehabilitation     Barriers to Discharge        Equipment Recommendations  None recommended by PT    Recommendations for Other Services OT consult  Frequency 7X/week   Progress towards PT Goals Progress towards PT goals: Progressing toward goals  Plan Current plan remains appropriate    Precautions / Restrictions Precautions Precautions: Knee Required Braces or Orthoses: Knee Immobilizer - Left Knee Immobilizer - Left: Discontinue once straight leg raise with < 10 degree lag Restrictions Weight Bearing Restrictions: No Other Position/Activity Restrictions: WBAT   Pertinent Vitals/Pain 10/10; RN aware, ice packs provided    Mobility  Bed Mobility Bed Mobility: Not assessed Supine to Sit: 1: +2 Total assist Supine to Sit: Patient Percentage: 50% Sit to Supine: 1: +2 Total assist Sit to Supine: Patient Percentage: 50% Details for Bed Mobility Assistance: Pt in recliner upon enternig room Transfers Transfers: Sit to Stand;Stand to Sit Sit to Stand: From chair/3-in-1;1: +2 Total assist Sit to Stand: Patient Percentage: 60% Stand to Sit: To chair/3-in-1;1: +1 Total assist Stand to Sit: Patient Percentage: 60% Details for Transfer Assistance: verbal cues for safety, correct hand placement Ambulation/Gait Ambulation/Gait Assistance: 1: +2 Total assist Ambulation/Gait: Patient Percentage: 70% Ambulation Distance (Feet): 58 Feet (and 20) Assistive device: Rolling walker Ambulation/Gait Assistance Details: cues for posture, sequence, stride length, position from RW Gait Pattern: Step-to pattern;Decreased step  length - right;Decreased step length - left;Shuffle;Trunk flexed    Exercises Total Joint Exercises Ankle Circles/Pumps: AROM;15 reps;Supine;Both Quad Sets: AROM;10 reps;Supine;Both Heel Slides: AAROM;10 reps;Supine;Left Straight Leg Raises: AAROM;Left;10 reps;Supine   PT Diagnosis: Difficulty walking  PT Problem List: Decreased strength;Decreased range of motion;Decreased activity tolerance;Decreased mobility;Pain;Obesity;Decreased knowledge of use of DME PT Treatment Interventions: DME instruction;Gait training;Functional mobility training;Therapeutic activities;Therapeutic exercise;Patient/family education   PT Goals (current goals can now be found in the care plan section) Acute Rehab PT Goals Patient Stated Goal: Go to rehab then return home PT Goal Formulation: With patient Time For Goal Achievement: 04/17/13 Potential to Achieve Goals: Good  Visit Information  Last PT Received On: 04/11/13 Assistance Needed: +2 PT/OT Co-Evaluation/Treatment: Yes History of Present Illness: Pt with L TKR    Subjective Data  Patient Stated Goal: Go to rehab then return home   Cognition  Cognition Arousal/Alertness: Awake/alert Behavior During Therapy: WFL for tasks assessed/performed Overall Cognitive Status: Within Functional Limits for tasks assessed    Balance  Balance Balance Assessed: No  End of Session PT - End of Session Equipment Utilized During Treatment: Gait belt;Left knee immobilizer Activity Tolerance: Patient tolerated treatment well Patient left: in bed;in CPM;with nursing/sitter in room Nurse Communication: Mobility status;Patient requests pain meds CPM Left Knee CPM Left Knee: Off   GP     Letonya Mangels 04/11/2013, 4:25 PM

## 2013-04-11 NOTE — Progress Notes (Signed)
Clinical Social Work Department CLINICAL SOCIAL WORK PLACEMENT NOTE 04/11/2013  Patient:  Jessica Lopez, Jessica Lopez  Account Number:  [id number] Admit date:  04/10/2013  Clinical Social Worker:  Cori Razor, LCSW  Date/time:  04/11/2013 12:39 PM  Clinical Social Work is seeking post-discharge placement for this patient at the following level of care:   SKILLED NURSING   (*CSW will update this form in Epic as items are completed)   04/11/2013  Patient/family provided with [facility] Department of Clinical Social Work's list of facilities offering this level of care within the geographic area requested by the patient (or if unable, by the patient's family).  04/11/2013  Patient/family informed of their freedom to choose among providers that offer the needed level of care, that participate in Medicare, Medicaid or managed care program needed by the patient, have an available bed and are willing to accept the patient.  04/11/2013  Patient/family informed of [facility]' ownership interest in [facility], as well as of the fact that they are under no obligation to receive care at this facility.  PASARR submitted to EDS on 04/11/2013 PASARR number received from [employer] on   FL2 transmitted to all facilities in geographic area requested by pt/family on  04/11/2013 FL2 transmitted to all facilities within larger geographic area on   Patient informed that his/her managed care company has contracts with or will negotiate with  certain facilities, including the following:     Patient/family informed of bed offers received:   Patient chooses bed at  Physician recommends and patient chooses bed at    Patient to be transferred to  on   Patient to be transferred to facility by   The following physician request were entered in Epic:   Additional Comments:  Cori Razor LCSW [phone]

## 2013-04-11 NOTE — Progress Notes (Signed)
CSW assisting with d/c planning. Pt has accepted SNF bed at [facility] . D/C Summary sent to [facility] for possible week end d/c. CSW will assist with d/c planning to SNF.  Cori Razor LCSW [phone]

## 2013-04-11 NOTE — Evaluation (Signed)
Physical Therapy Evaluation Patient Details Name: Jessica Lopez MRN: [id number] DOB: 1941-03-10 Today's Date: 04/11/2013 Time: 9528-4132 PT Time Calculation (min): 33 min  PT Assessment / Plan / Recommendation History of Present Illness  Pt with L TKR  Clinical Impression  Pt s/p L TKR presents with functional mobility limitations 2* decreased L LE strength/ROM, decreased R knee flex, and post op pain.  Pt would greatly benefit from follow rehab at SNF level prior to d/c home with limited assist    PT Assessment  Patient needs continued PT services    Follow Up Recommendations  SNF    Does the patient have the potential to tolerate intense rehabilitation      Barriers to Discharge        Equipment Recommendations  None recommended by PT    Recommendations for Other Services OT consult   Frequency 7X/week    Precautions / Restrictions Precautions Precautions: Knee Required Braces or Orthoses: Knee Immobilizer - Left Knee Immobilizer - Left: On at all times Restrictions Weight Bearing Restrictions: No Other Position/Activity Restrictions: WBAT   Pertinent Vitals/Pain 10/10; premed, cold packs provided, RN aware      Mobility  Bed Mobility Bed Mobility: Not assessed Supine to Sit: 1: +2 Total assist Supine to Sit: Patient Percentage: 50% Details for Bed Mobility Assistance: Pt in recliner upon enternig room Transfers Transfers: Sit to Stand;Stand to Sit Sit to Stand: From chair/3-in-1;1: +2 Total assist Sit to Stand: Patient Percentage: 50% Stand to Sit: To chair/3-in-1;1: +1 Total assist Stand to Sit: Patient Percentage: 60% Details for Transfer Assistance: verbal cues for safety, correct hand placement Ambulation/Gait Ambulation/Gait Assistance: 1: +2 Total assist Ambulation/Gait: Patient Percentage: 60% Ambulation Distance (Feet): 28 Feet Assistive device: Rolling walker Ambulation/Gait Assistance Details: cues for sequence, posture, position from RW  and stride length Gait Pattern: Step-to pattern;Decreased step length - right;Decreased step length - left;Shuffle;Trunk flexed    Exercises Total Joint Exercises Ankle Circles/Pumps: AROM;15 reps;Supine;Both Quad Sets: AROM;10 reps;Supine;Both Heel Slides: AAROM;10 reps;Supine;Left Straight Leg Raises: AAROM;Left;10 reps;Supine   PT Diagnosis: Difficulty walking  PT Problem List: Decreased strength;Decreased range of motion;Decreased activity tolerance;Decreased mobility;Pain;Obesity;Decreased knowledge of use of DME PT Treatment Interventions: DME instruction;Gait training;Functional mobility training;Therapeutic activities;Therapeutic exercise;Patient/family education     PT Goals(Current goals can be found in the care plan section) Acute Rehab PT Goals Patient Stated Goal: Go to rehab then return home PT Goal Formulation: With patient Time For Goal Achievement: 04/17/13 Potential to Achieve Goals: Good  Visit Information  Last PT Received On: 04/11/13 Assistance Needed: +2 History of Present Illness: Pt with L TKR       Prior Functioning  Home Living Family/patient expects to be discharged to:: Skilled nursing facility Additional Comments: pals to d/c to SNF for further rehab Prior Function Level of Independence: Independent Comments: used cane, walker Communication Communication: No difficulties Dominant Hand: Left    Cognition  Cognition Arousal/Alertness: Awake/alert Behavior During Therapy: WFL for tasks assessed/performed Overall Cognitive Status: Within Functional Limits for tasks assessed    Extremity/Trunk Assessment Upper Extremity Assessment Upper Extremity Assessment: Overall WFL for tasks assessed;Generalized weakness Lower Extremity Assessment Lower Extremity Assessment: RLE deficits/detail;LLE deficits/detail RLE Deficits / Details: Strength WFL with AROM ltd to ~60 flex LLE Deficits / Details: 2-/5 with AAROM at knee -15 - 40   Balance  Balance Balance Assessed: No  End of Session PT - End of Session Equipment Utilized During Treatment: Gait belt;Left knee immobilizer Activity Tolerance: Patient tolerated treatment well Patient  left: in chair;with call bell/phone within reach;with family/visitor present Nurse Communication: Mobility status CPM Left Knee CPM Left Knee: Off  GP     Shatina Streets 04/11/2013, 4:17 PM

## 2013-04-11 NOTE — Progress Notes (Signed)
Clinical Social Work Department BRIEF PSYCHOSOCIAL ASSESSMENT 04/11/2013  Patient:  Jessica Lopez     Account Number:  [id number]     Admit date:  04/10/2013  Clinical Social Worker:  Candie Chroman  Date/Time:  04/11/2013 12:32 PM  Referred by:  Physician  Date Referred:  04/11/2013 Referred for  SNF Placement   Other Referral:   Interview type:   Other interview type:    PSYCHOSOCIAL DATA Living Status:  HUSBAND Admitted from facility:   Level of care:   Primary support name:  Kiri Hinderliter Primary support relationship to patient:  SPOUSE Degree of support available:   unclear    CURRENT CONCERNS Current Concerns  Post-Acute Placement   Other Concerns:    SOCIAL WORK ASSESSMENT / PLAN Pt is a 72 yr old female living at home prior to hospitalization. CSW met with pt / daughter to assist with d/c planning. ST SNF is needed following hospital d/c. SNF search initiated . Pt would like to have rehab at [facility] & rehab. SNF has been contacted and decision is pending. CSW will continue to follow to assist with d/c planing needs.   Assessment/plan status:  Psychosocial Support/Ongoing Assessment of Needs Other assessment/ plan:   Information/referral to community resources:   SNF list provided. Insurance coverage for SNF and ambulance transport reviewed. Pt is aware that insurance may not cover ambulance transport.    PATIENT'S/FAMILY'S RESPONSE TO PLAN OF CARE: Pt is looking forward to feeling better and having rehab, hopefully, at [facility].   Jessica Razor LCSW [phone]

## 2013-04-11 NOTE — Discharge Summary (Signed)
Physician Discharge Summary   Patient ID: Jessica Lopez MRN: [id number] DOB/AGE: 73-Dec-1942 73 y.o.  Admit date: 04/10/2013 Discharge date: anticipated 04/13/13  Primary Diagnosis: left knee DJD  Admission Diagnoses:  Past Medical History  Diagnosis Date  . Osteoarthritis   . Lower back pain   . GERD (gastroesophageal reflux disease)   . Chronic constipation   . Hypertension   . Headache(784.0)     migraines  . Anxiety   . CHF (congestive heart failure)   . Anemia    Discharge Diagnoses:   Principal Problem:   Left knee DJD  Estimated body mass index is 38.96 kg/(m^2) as calculated from the following:   Height as of this encounter: 5\' 4"  (1.626 m).   Weight as of this encounter: 103 kg (227 lb 1.2 oz).  Procedure:  Procedure(s) (LRB): LEFT TOTAL KNEE ARTHROPLASTY (Left)   Consults: None  HPI: see H&P Laboratory Data: Admission on 04/10/2013  Component Date Value Range Status  . WBC 04/11/2013 13.9* 4.0 - 10.5 K/uL Final  . RBC 04/11/2013 3.89  3.87 - 5.11 MIL/uL Final  . Hemoglobin 04/11/2013 11.1* 12.0 - 15.0 g/dL Final  . HCT 14/78/2956 33.4* 36.0 - 46.0 % Final  . MCV 04/11/2013 85.9  78.0 - 100.0 fL Final  . MCH 04/11/2013 28.5  26.0 - 34.0 pg Final  . MCHC 04/11/2013 33.2  30.0 - 36.0 g/dL Final  . RDW 21/30/8657 13.5  11.5 - 15.5 % Final  . Platelets 04/11/2013 220  150 - 400 K/uL Final  . Sodium 04/11/2013 134* 135 - 145 mEq/L Final  . Potassium 04/11/2013 4.4  3.5 - 5.1 mEq/L Final  . Chloride 04/11/2013 100  96 - 112 mEq/L Final  . CO2 04/11/2013 27  19 - 32 mEq/L Final  . Glucose, Bld 04/11/2013 147* 70 - 99 mg/dL Final  . BUN 84/69/6295 11  6 - 23 mg/dL Final  . Creatinine, Ser 04/11/2013 0.89  0.50 - 1.10 mg/dL Final  . Calcium 28/41/3244 8.9  8.4 - 10.5 mg/dL Final  . GFR calc non Af Amer 04/11/2013 63* >90 mL/min Final  . GFR calc Af Amer 04/11/2013 73* >90 mL/min Final   Comment: (NOTE)                          The eGFR has been  calculated using the CKD EPI equation.                          This calculation has not been validated in all clinical situations.                          eGFR's persistently <90 mL/min signify possible Chronic Kidney                          Disease.  Hospital Outpatient Visit on 04/07/2013  Component Date Value Range Status  . ABO/RH(D) 04/07/2013 O POS   Final  Hospital Outpatient Visit on 04/07/2013  Component Date Value Range Status  . Sodium 04/07/2013 139  135 - 145 mEq/L Final  . Potassium 04/07/2013 4.1  3.5 - 5.1 mEq/L Final  . Chloride 04/07/2013 101  96 - 112 mEq/L Final  . CO2 04/07/2013 30  19 - 32 mEq/L Final  . Glucose, Bld 04/07/2013 92  70 - 99 mg/dL Final  .  BUN 04/07/2013 11  6 - 23 mg/dL Final  . Creatinine, Ser 04/07/2013 0.79  0.50 - 1.10 mg/dL Final  . Calcium 29/52/8413 9.7  8.4 - 10.5 mg/dL Final  . GFR calc non Af Amer 04/07/2013 81* >90 mL/min Final  . GFR calc Af Amer 04/07/2013 >90  >90 mL/min Final   Comment: (NOTE)                          The eGFR has been calculated using the CKD EPI equation.                          This calculation has not been validated in all clinical situations.                          eGFR's persistently <90 mL/min signify possible Chronic Kidney                          Disease.  . WBC 04/07/2013 9.3  4.0 - 10.5 K/uL Final  . RBC 04/07/2013 4.69  3.87 - 5.11 MIL/uL Final  . Hemoglobin 04/07/2013 13.6  12.0 - 15.0 g/dL Final  . HCT 24/40/1027 40.9  36.0 - 46.0 % Final  . MCV 04/07/2013 87.2  78.0 - 100.0 fL Final  . MCH 04/07/2013 29.0  26.0 - 34.0 pg Final  . MCHC 04/07/2013 33.3  30.0 - 36.0 g/dL Final  . RDW 25/36/6440 13.5  11.5 - 15.5 % Final  . Platelets 04/07/2013 222  150 - 400 K/uL Final  . Prothrombin Time 04/07/2013 11.7  11.6 - 15.2 seconds Final  . INR 04/07/2013 0.87  0.00 - 1.49 Final  . ABO/RH(D) 04/07/2013 O POS   Final  . Antibody Screen 04/07/2013 NEG   Final  . Sample Expiration 04/07/2013 04/13/2013    Final  . Color, Urine 04/07/2013 YELLOW  YELLOW Final  . APPearance 04/07/2013 CLEAR  CLEAR Final  . Specific Gravity, Urine 04/07/2013 1.027  1.005 - 1.030 Final  . pH 04/07/2013 6.5  5.0 - 8.0 Final  . Glucose, UA 04/07/2013 NEGATIVE  NEGATIVE mg/dL Final  . Hgb urine dipstick 04/07/2013 NEGATIVE  NEGATIVE Final  . Bilirubin Urine 04/07/2013 NEGATIVE  NEGATIVE Final  . Ketones, ur 04/07/2013 NEGATIVE  NEGATIVE mg/dL Final  . Protein, ur 34/74/2595 NEGATIVE  NEGATIVE mg/dL Final  . Urobilinogen, UA 04/07/2013 0.2  0.0 - 1.0 mg/dL Final  . Nitrite 63/87/5643 NEGATIVE  NEGATIVE Final  . Leukocytes, UA 04/07/2013 MODERATE* NEGATIVE Final  . aPTT 04/07/2013 37  24 - 37 seconds Final   Comment:                                 IF BASELINE aPTT IS ELEVATED,                          SUGGEST PATIENT RISK ASSESSMENT                          BE USED TO DETERMINE APPROPRIATE                          ANTICOAGULANT THERAPY.  . WBC, UA 04/07/2013 7-10  <3 WBC/hpf  Final  . Bacteria, UA 04/07/2013 RARE  RARE Final  . Urine-Other 04/07/2013 MUCOUS PRESENT   Final  . MRSA, PCR 04/07/2013 NEGATIVE  NEGATIVE Final  . Staphylococcus aureus 04/07/2013 POSITIVE* NEGATIVE Final   Comment:                                 The Xpert SA Assay (FDA                          approved for NASAL specimens                          in patients over 92 years of age),                          is one component of                          a comprehensive surveillance                          program.  Test performance has                          been validated by [employer] for patients greater                          than or equal to 51 year old.                          It is not intended                          to diagnose infection nor to                          guide or monitor treatment.     X-Rays:Dg Chest 2 View  04/07/2013   *RADIOLOGY REPORT*  Clinical Data: Preoperative  evaluation for left total knee arthroplasty, history hypertension, smoking, CHF  CHEST - 2 VIEW  Comparison: 02/28/2005  Findings: Normal heart size and pulmonary vascularity. Calcified tortuous aorta. Linear scarring left upper lobe. Lungs otherwise clear. No pleural effusion or pneumothorax. Bones unremarkable.  IMPRESSION: Linear left upper lobe scarring.   Original Report Authenticated By: Ulyses Southward, M.D.   Dg Knee 1-2 Views Left  04/07/2013   *RADIOLOGY REPORT*  Clinical Data: Degenerative joint disease, preoperative evaluation for total knee replacement  LEFT KNEE - 1-2 VIEW  Comparison: None  Findings: Tricompartmental total osteoarthritic changes with joint space narrowing and marginal spur formation. Bones appear demineralized. Minimal lateral subluxation of tibia. No acute fracture, dislocation or bone destruction. No knee joint effusion.  IMPRESSION: Osteoarthritic changes left knee.   Original Report Authenticated By: Ulyses Southward, M.D.   Dg Knee Left Port  04/10/2013   CLINICAL DATA:  Post left knee replacement  EXAM: PORTABLE LEFT KNEE - 1-2 VIEW  COMPARISON:  04/07/2013  FINDINGS: Two views of left knee submitted.  There is left knee prosthesis in anatomic alignment. Postsurgical changes are noted with midline skin staples. A postsurgical drain in place is noted. Small amount of periarticular soft tissue air.  IMPRESSION: Left knee prosthesis in anatomic alignment. Postsurgical changes are noted.   Electronically Signed   By: Natasha Mead   On: 04/10/2013 14:31    EKG: Orders placed in visit on 04/07/13  . EKG 12-LEAD     Hospital Course: Jessica Lopez is a 72 y.o. who was admitted to [facility]. They were brought to the operating room on 04/10/2013 and underwent Procedure(s): LEFT TOTAL KNEE ARTHROPLASTY.  Patient tolerated the procedure well and was later transferred to the recovery room and then to the orthopaedic floor for postoperative care.  They were given PO and IV  analgesics for pain control following their surgery.  They were given 24 hours of postoperative antibiotics of  Anti-infectives   Start     Dose/Rate Route Frequency Ordered Stop   04/10/13 1800  clindamycin (CLEOCIN) IVPB 900 mg  Status:  Discontinued     900 mg 100 mL/hr over 30 Minutes Intravenous 3 times per day 04/10/13 1416 04/10/13 1551   04/10/13 1800  clindamycin (CLEOCIN) IVPB 900 mg     900 mg 100 mL/hr over 30 Minutes Intravenous 4 times per day 04/10/13 1551 04/11/13 0712   04/10/13 1600  ceFAZolin (ANCEF) IVPB 2 g/50 mL premix     2 g 100 mL/hr over 30 Minutes Intravenous Every 6 hours 04/10/13 1416 04/10/13 2219   04/10/13 1204  polymyxin B 500,000 Units, bacitracin 50,000 Units in sodium chloride irrigation 0.9 % 500 mL irrigation  Status:  Discontinued       As needed 04/10/13 1204 04/10/13 1240   04/10/13 0800  ceFAZolin (ANCEF) IVPB 2 g/50 mL premix     2 g 100 mL/hr over 30 Minutes Intravenous On call to O.R. 04/10/13 0757 04/10/13 1040   04/10/13 0757  clindamycin (CLEOCIN) IVPB 900 mg     900 mg 100 mL/hr over 30 Minutes Intravenous On call to O.R. 04/10/13 0757 04/10/13 1050     and started on DVT prophylaxis in the form of Xarelto, TED hose and SCDs.   PT and OT were ordered for total joint protocol.  Discharge planning consulted to help with postop disposition and equipment needs.  Patient had a difficult night on the evening of surgery.  They started to get up OOB with therapy on day one. Hemovac drain was pulled without difficulty.  Continued to work with therapy into day two. By day three, the patient had progressed with therapy and meeting their goals.  Incision was healing well.  Patient was seen in rounds and was ready for D/C.   Discharge Medications: Prior to Admission medications   Medication Sig Start Date End Date Taking? Authorizing Provider  ALPRAZolam (XANAX) 0.25 MG tablet 1 tablet 4 (four) times daily as needed for anxiety.  12/26/12  Yes Historical  Provider, MD  amLODipine-benazepril (LOTREL) 5-20 MG per capsule Take 1 capsule by mouth every morning.  12/12/12  Yes Historical Provider, MD  bifidobacterium infantis (ALIGN) capsule Take 1 capsule by mouth daily.   Yes Historical Provider, MD  DULoxetine (CYMBALTA) 60 MG capsule Take 60 mg by mouth every morning.  01/23/13  Yes Historical Provider, MD  fish oil-omega-3 fatty acids 1000 MG capsule Take 2 g by mouth daily.   Yes Historical Provider, MD  folic acid (FOLVITE) 1 MG tablet Take  1 mg by mouth daily.   Yes Historical Provider, MD  ibuprofen (ADVIL,MOTRIN) 800 MG tablet Take 800 mg by mouth every 8 (eight) hours as needed for pain.  10/25/11  Yes Historical Provider, MD  KLOR-CON M20 20 MEQ tablet Take 20 mEq by mouth daily.  08/16/11  Yes Historical Provider, MD  Linaclotide (LINZESS) 290 MCG CAPS Take 290 mcg by mouth daily. 01/27/13  Yes Tiffany Kocher, PA-C  NEXIUM 40 MG capsule Take 40 mg by mouth daily before breakfast.  10/23/11  Yes Historical Provider, MD  Oxycodone HCl 20 MG TABS Take 20 mg by mouth every 4 (four) hours while awake.  09/06/11  Yes Historical Provider, MD  rizatriptan (MAXALT) 10 MG tablet Take 10 mg by mouth daily as needed for migraine.  01/19/13  Yes Historical Provider, MD  tiZANidine (ZANAFLEX) 4 MG tablet Take 4 mg by mouth 3 (three) times daily.  01/03/13  Yes Historical Provider, MD  vitamin B-12 (CYANOCOBALAMIN) 100 MCG tablet Take 100 mcg by mouth daily.   Yes Historical Provider, MD  clonazePAM (KLONOPIN) 0.5 MG tablet Take 0.5-1 mg by mouth at bedtime as needed for anxiety (sleep).    Historical Provider, MD  fexofenadine (ALLEGRA) 180 MG tablet Take 180 mg by mouth daily.    Historical Provider, MD  furosemide (LASIX) 40 MG tablet Take 40 mg by mouth 2 (two) times daily.  09/25/11   Historical Provider, MD  HYDROmorphone (DILAUDID) 2 MG tablet Take 1 tablet (2 mg total) by mouth every 4 (four) hours as needed for pain (for severe breakthrough pain). 04/11/13    Dayna Barker. Bissell, PA-C  rivaroxaban (XARELTO) 10 MG TABS tablet Take 1 tablet (10 mg total) by mouth daily. 04/10/13   Dayna Barker. Christene Lye, PA-C    Diet: Regular diet Activity:WBAT Follow-up:in 10-14 days Disposition - Rehab / SNF Discharged Condition: good      Medication List    TAKE these medications       HYDROmorphone 2 MG tablet  Commonly known as:  DILAUDID  Take 1 tablet (2 mg total) by mouth every 4 (four) hours as needed for pain (for severe breakthrough pain).     rivaroxaban 10 MG Tabs tablet  Commonly known as:  XARELTO  Take 1 tablet (10 mg total) by mouth daily.      ASK your doctor about these medications       ALPRAZolam 0.25 MG tablet  Commonly known as:  XANAX  1 tablet 4 (four) times daily as needed for anxiety.     amLODipine-benazepril 5-20 MG per capsule  Commonly known as:  LOTREL  Take 1 capsule by mouth every morning.     bifidobacterium infantis capsule  Take 1 capsule by mouth daily.     clonazePAM 0.5 MG tablet  Commonly known as:  KLONOPIN  Take 0.5-1 mg by mouth at bedtime as needed for anxiety (sleep).     DULoxetine 60 MG capsule  Commonly known as:  CYMBALTA  Take 60 mg by mouth every morning.     fexofenadine 180 MG tablet  Commonly known as:  ALLEGRA  Take 180 mg by mouth daily.     fish oil-omega-3 fatty acids 1000 MG capsule  Take 2 g by mouth daily.     folic acid 1 MG tablet  Commonly known as:  FOLVITE  Take 1 mg by mouth daily.     furosemide 40 MG tablet  Commonly known as:  LASIX  Take 40 mg  by mouth 2 (two) times daily.     ibuprofen 800 MG tablet  Commonly known as:  ADVIL,MOTRIN  Take 800 mg by mouth every 8 (eight) hours as needed for pain.     KLOR-CON M20 20 MEQ tablet  Generic drug:  potassium chloride SA  Take 20 mEq by mouth daily.     Linaclotide 290 MCG Caps capsule  Commonly known as:  LINZESS  Take 290 mcg by mouth daily.     NEXIUM 40 MG capsule  Generic drug:  esomeprazole  Take 40 mg  by mouth daily before breakfast.     Oxycodone HCl 20 MG Tabs  Take 20 mg by mouth every 4 (four) hours while awake.     rizatriptan 10 MG tablet  Commonly known as:  MAXALT  Take 10 mg by mouth daily as needed for migraine.     tiZANidine 4 MG tablet  Commonly known as:  ZANAFLEX  Take 4 mg by mouth 3 (three) times daily.     vitamin B-12 100 MCG tablet  Commonly known as:  CYANOCOBALAMIN  Take 100 mcg by mouth daily.           Follow-up Information   Follow up with BEANE,JEFFREY C, MD In 2 weeks.   Specialty:  Orthopedic Surgery   Contact information:   [address] [place] [address] [phone]       Signed: [place]. 04/11/2013, 7:44 AM

## 2013-04-11 NOTE — Plan of Care (Signed)
Problem: Consults Goal: Diagnosis- Total Joint Replacement Outcome: Completed/Met Date Met:  04/11/13 Primary Total Knee Left

## 2013-04-12 LAB — CBC
Hemoglobin: 9.8 g/dL — ABNORMAL LOW (ref 12.0–15.0)
MCHC: 32.9 g/dL (ref 30.0–36.0)
RDW: 14.1 % (ref 11.5–15.5)
WBC: 14 10*3/uL — ABNORMAL HIGH (ref 4.0–10.5)

## 2013-04-12 NOTE — Progress Notes (Signed)
Physical Therapy Treatment Patient Details Name: Jessica Lopez MRN: [id number] DOB: 25-Feb-1941 Today's Date: 04/12/2013 Time: 4098-1191 PT Time Calculation (min): 40 min  PT Assessment / Plan / Recommendation  History of Present Illness     PT Comments     Follow Up Recommendations  SNF     Does the patient have the potential to tolerate intense rehabilitation     Barriers to Discharge        Equipment Recommendations  None recommended by PT    Recommendations for Other Services OT consult  Frequency 7X/week   Progress towards PT Goals Progress towards PT goals: Progressing toward goals  Plan Current plan remains appropriate    Precautions / Restrictions Precautions Precautions: Knee Required Braces or Orthoses: Knee Immobilizer - Left Knee Immobilizer - Left: Discontinue once straight leg raise with < 10 degree lag Restrictions Weight Bearing Restrictions: No Other Position/Activity Restrictions: WBAT   Pertinent Vitals/Pain 6/10; premed, cold packs provided    Mobility  Bed Mobility Bed Mobility: Supine to Sit Supine to Sit: 3: Mod assist Details for Bed Mobility Assistance: Cues for sequence and use of R LE to self assist.  Assist for LE management and to bring trunk to upright Transfers Transfers: Sit to Stand;Stand to Sit Sit to Stand: 1: +2 Total assist;From bed;With upper extremity assist Sit to Stand: Patient Percentage: 70% Stand to Sit: To chair/3-in-1;1: +2 Total assist;With upper extremity assist;With armrests Stand to Sit: Patient Percentage: 70% Details for Transfer Assistance: VC for LE management and use of UEs.  Physical assist to control descent Ambulation/Gait Ambulation/Gait Assistance: 4: Min assist;3: Mod assist Ambulation Distance (Feet): 89 Feet Assistive device: Rolling walker Ambulation/Gait Assistance Details: cues for sequence, posture, position from RW and stride length Gait Pattern: Step-to pattern;Decreased step length -  right;Decreased step length - left;Shuffle;Trunk flexed    Exercises Total Joint Exercises Ankle Circles/Pumps: AROM;Supine;Both;20 reps Quad Sets: AROM;Supine;Both;20 reps Heel Slides: AAROM;Supine;Left;20 reps Straight Leg Raises: AAROM;Left;Supine;20 reps Goniometric ROM: AAROM at knee -10 - 60   PT Diagnosis:    PT Problem List:   PT Treatment Interventions:     PT Goals (current goals can now be found in the care plan section) Acute Rehab PT Goals Patient Stated Goal: Go to rehab then return home PT Goal Formulation: With patient Time For Goal Achievement: 04/17/13 Potential to Achieve Goals: Good  Visit Information  Last PT Received On: 04/12/13 Assistance Needed: +2    Subjective Data  Patient Stated Goal: Go to rehab then return home   Cognition  Cognition Arousal/Alertness: Awake/alert Behavior During Therapy: WFL for tasks assessed/performed Overall Cognitive Status: Within Functional Limits for tasks assessed    Balance     End of Session PT - End of Session Equipment Utilized During Treatment: Gait belt;Left knee immobilizer Activity Tolerance: Patient tolerated treatment well Patient left: in chair;with call bell/phone within reach;with family/visitor present Nurse Communication: Mobility status;Patient requests pain meds   GP     Ericson Nafziger 04/12/2013, 1:09 PM

## 2013-04-12 NOTE — Progress Notes (Signed)
Subjective: Doing well.  Pain controlled.     Objective: Vital signs in last 24 hours: Temp:  [98.2 F (36.8 C)-99.1 F (37.3 C)] 98.8 F (37.1 C) (09/13 0539) Pulse Rate:  [107-127] 127 (09/13 0539) Resp:  [16-18] 16 (09/13 0800) BP: (108-156)/(63-78) 144/74 mmHg (09/13 0539) SpO2:  [91 %-96 %] 95 % (09/13 0800)  Intake/Output from previous day: 09/12 0701 - 09/13 0700 In: 720 [P.O.:720] Out: 300 [Urine:300] Intake/Output this shift: Total I/O In: 360 [P.O.:360] Out: -    Recent Labs  04/11/13 0410 04/12/13 0432  HGB 11.1* 9.8*    Recent Labs  04/11/13 0410 04/12/13 0432  WBC 13.9* 14.0*  RBC 3.89 3.44*  HCT 33.4* 29.8*  PLT 220 189    Recent Labs  04/11/13 0410  NA 134*  K 4.4  CL 100  CO2 27  BUN 11  CREATININE 0.89  GLUCOSE 147*  CALCIUM 8.9   No results found for this basename: LABPT, INR,  in the last 72 hours  Exam:  Knee wound looks good.  Staples intact.  No drainage or signs of infection.  Calf nt, nvi.  New aquacel dressing applied.    Assessment/Plan: Awaiting snf placement today or Sunday.  Continue present care.     Willena Jeancharles M 04/12/2013, 9:41 AM

## 2013-04-13 LAB — CBC
HCT: 27.8 % — ABNORMAL LOW (ref 36.0–46.0)
Hemoglobin: 9 g/dL — ABNORMAL LOW (ref 12.0–15.0)
MCH: 28.2 pg (ref 26.0–34.0)
MCHC: 32.4 g/dL (ref 30.0–36.0)
MCV: 87.1 fL (ref 78.0–100.0)
Platelets: 214 10*3/uL (ref 150–400)
RBC: 3.19 MIL/uL — ABNORMAL LOW (ref 3.87–5.11)
RDW: 14.1 % (ref 11.5–15.5)
WBC: 13.2 10*3/uL — ABNORMAL HIGH (ref 4.0–10.5)

## 2013-04-13 MED ORDER — OXYCODONE HCL 20 MG PO TABS: 20.0000 mg | ORAL_TABLET | ORAL | Status: DC | PRN

## 2013-04-13 MED ORDER — CLONAZEPAM 0.5 MG PO TABS: 0.5000 mg | ORAL_TABLET | Freq: Every evening | ORAL | Status: DC | PRN

## 2013-04-13 MED ORDER — ALPRAZOLAM 0.25 MG PO TABS: 0.2500 mg | ORAL_TABLET | Freq: Four times a day (QID) | ORAL | Status: DC | PRN

## 2013-04-13 NOTE — Progress Notes (Signed)
Report called to Keats ,Charity fundraiser at [facility].

## 2013-04-13 NOTE — Progress Notes (Addendum)
Occupational Therapy Discharge Patient Details Name: Jessica Lopez MRN: [id number] DOB: Sep 06, 1940 Today's Date: 04/13/2013 Time:  -     Patient discharged from OT services secondary to pt is Medicare and going SNF. Defer remainder of OT treatment to that facility as they deem appropriate.          Evette Georges [phone] 04/13/2013, 7:20 AM

## 2013-04-13 NOTE — Progress Notes (Signed)
   Subjective: 3 Days Post-Op Procedure(s) (LRB): LEFT TOTAL KNEE ARTHROPLASTY (Left) Patient reports pain as moderate.   Patient seen in rounds with Dr. Lequita Halt.  Family in room Patient is well, but has had some minor complaints of pain in the knee, requiring pain medications Plan is to go Skilled nursing facility after hospital stay.  Objective: Vital signs in last 24 hours: Temp:  [98.3 F (36.8 C)-99.3 F (37.4 C)] 99.3 F (37.4 C) (09/14 0448) Pulse Rate:  [110-115] 110 (09/14 0448) Resp:  [16-18] 18 (09/14 0448) BP: (103-134)/(6-77) 134/77 mmHg (09/14 0448) SpO2:  [93 %-96 %] 93 % (09/14 0448)  Intake/Output from previous day:  Intake/Output Summary (Last 24 hours) at 04/13/13 0738 Last data filed at 04/13/13 0448  Gross per 24 hour  Intake    600 ml  Output    800 ml  Net   -200 ml     Labs:  Recent Labs  04/11/13 0410 04/12/13 0432 04/13/13 0500  HGB 11.1* 9.8* 9.0*    Recent Labs  04/12/13 0432 04/13/13 0500  WBC 14.0* 13.2*  RBC 3.44* 3.19*  HCT 29.8* 27.8*  PLT 189 214    Recent Labs  04/11/13 0410  NA 134*  K 4.4  CL 100  CO2 27  BUN 11  CREATININE 0.89  GLUCOSE 147*  CALCIUM 8.9   No results found for this basename: LABPT, INR,  in the last 72 hours  EXAM General - Patient is Alert, Appropriate and Oriented Extremity - Neurovascular intact Sensation intact distally Dorsiflexion/Plantar flexion intact Dressing/Incision - clean, dry Motor Function - intact, moving foot and toes well on exam.   Past Medical History  Diagnosis Date  . Osteoarthritis   . Lower back pain   . GERD (gastroesophageal reflux disease)   . Chronic constipation   . Hypertension   . Headache(784.0)     migraines  . Anxiety   . CHF (congestive heart failure)   . Anemia     Assessment/Plan: 3 Days Post-Op Procedure(s) (LRB): LEFT TOTAL KNEE ARTHROPLASTY (Left) Principal Problem:   Left knee DJD  Estimated body mass index is 38.96 kg/(m^2) as  calculated from the following:   Height as of this encounter: 5\' 4"  (1.626 m).   Weight as of this encounter: 103 kg (227 lb 1.2 oz). Up with therapy Plan for discharge tomorrow Discharge to SNF  DVT Prophylaxis - Xarelto Weight-Bearing as tolerated to left leg  Azelie Noguera 04/13/2013, 7:38 AM

## 2013-04-13 NOTE — Progress Notes (Signed)
Pt stable, all paper work sent with pt.  Pt transported to [facility] via [facility].

## 2013-04-13 NOTE — Progress Notes (Signed)
Physical Therapy Treatment Patient Details Name: Jessica Lopez MRN: [id number] DOB: 1941-07-02 Today's Date: 04/13/2013 Time: 0750-0825 PT Time Calculation (min): 35 min  PT Assessment / Plan / Recommendation  History of Present Illness     PT Comments     Follow Up Recommendations  SNF     Does the patient have the potential to tolerate intense rehabilitation     Barriers to Discharge        Equipment Recommendations  None recommended by PT    Recommendations for Other Services OT consult  Frequency 7X/week   Progress towards PT Goals Progress towards PT goals: Progressing toward goals  Plan Current plan remains appropriate    Precautions / Restrictions Precautions Precautions: Knee Required Braces or Orthoses: Knee Immobilizer - Left Knee Immobilizer - Left: Discontinue once straight leg raise with < 10 degree lag Restrictions Weight Bearing Restrictions: No Other Position/Activity Restrictions: WBAT   Pertinent Vitals/Pain 5/10; premed ice packs provided    Mobility  Bed Mobility Bed Mobility: Supine to Sit Supine to Sit: 3: Mod assist Details for Bed Mobility Assistance: Cues for sequence and use of R LE to self assist.  Assist for LE management and to bring trunk to upright Transfers Transfers: Sit to Stand;Stand to Sit Sit to Stand: 3: Mod assist Stand to Sit: 3: Mod assist Details for Transfer Assistance: VC for LE management and use of UEs.  Physical assist to control descent Ambulation/Gait Ambulation/Gait Assistance: 4: Min assist Ambulation Distance (Feet): 150 Feet (and 20) Assistive device: Rolling walker Ambulation/Gait Assistance Details: cues for posture, sequence and position from RW Gait Pattern: Step-to pattern;Decreased step length - right;Decreased step length - left;Shuffle;Trunk flexed    Exercises     PT Diagnosis:    PT Problem List:   PT Treatment Interventions:     PT Goals (current goals can now be found in the care plan  section) Acute Rehab PT Goals Patient Stated Goal: Go to rehab then return home PT Goal Formulation: With patient Time For Goal Achievement: 04/17/13 Potential to Achieve Goals: Good  Visit Information  Last PT Received On: 04/13/13 Assistance Needed: +1    Subjective Data  Patient Stated Goal: Go to rehab then return home   Cognition  Cognition Arousal/Alertness: Awake/alert Behavior During Therapy: WFL for tasks assessed/performed Overall Cognitive Status: Within Functional Limits for tasks assessed    Balance     End of Session PT - End of Session Equipment Utilized During Treatment: Gait belt;Left knee immobilizer Activity Tolerance: Patient tolerated treatment well Patient left: in chair;with call bell/phone within reach;with family/visitor present Nurse Communication: Mobility status;Patient requests pain meds   GP     Lahna Nath 04/13/2013, 8:41 AM

## 2013-04-13 NOTE — Progress Notes (Signed)
Physical Therapy Treatment Patient Details Name: Jessica Lopez MRN: [id number] DOB: 1941/05/20 Today's Date: 04/13/2013 Time: 0927-0950 PT Time Calculation (min): 23 min  PT Assessment / Plan / Recommendation  History of Present Illness     PT Comments     Follow Up Recommendations  SNF     Does the patient have the potential to tolerate intense rehabilitation     Barriers to Discharge        Equipment Recommendations  None recommended by PT    Recommendations for Other Services OT consult  Frequency 7X/week   Progress towards PT Goals Progress towards PT goals: Progressing toward goals  Plan Current plan remains appropriate    Precautions / Restrictions Precautions Precautions: Knee Required Braces or Orthoses: Knee Immobilizer - Left Knee Immobilizer - Left: Discontinue once straight leg raise with < 10 degree lag Restrictions Weight Bearing Restrictions: No Other Position/Activity Restrictions: WBAT   Pertinent Vitals/Pain 7/10; premed, ice pack provided, RN aware    Mobility       Exercises Total Joint Exercises Ankle Circles/Pumps: AROM;Supine;Both;20 reps Quad Sets: AROM;Supine;Both;20 reps Heel Slides: AAROM;Supine;Left;20 reps Straight Leg Raises: AAROM;Left;Supine;20 reps Goniometric ROM: AAROM at L knee -10 - 60   PT Diagnosis:    PT Problem List:   PT Treatment Interventions:     PT Goals (current goals can now be found in the care plan section) Acute Rehab PT Goals Patient Stated Goal: Go to rehab then return home PT Goal Formulation: With patient Time For Goal Achievement: 04/17/13 Potential to Achieve Goals: Good  Visit Information  Last PT Received On: 04/13/13 Assistance Needed: +1    Subjective Data  Patient Stated Goal: Go to rehab then return home   Cognition  Cognition Arousal/Alertness: Awake/alert Behavior During Therapy: WFL for tasks assessed/performed Overall Cognitive Status: Within Functional Limits for tasks  assessed    Balance     End of Session PT - End of Session Equipment Utilized During Treatment: Left knee immobilizer Activity Tolerance: Patient tolerated treatment well Patient left: in chair;with call bell/phone within reach;with family/visitor present Nurse Communication: Mobility status;Patient requests pain meds   GP     Jessica Lopez 04/13/2013, 12:51 PM

## 2013-04-13 NOTE — Progress Notes (Signed)
Per MD, Pt ready for d/c.  Notified RN, Pt, family and facility.  Family is room.  Admission arranged by Weekday CSW.  Confirmed receipt of d/c summary.  Facility ready to receive Pt.  Arranged for transportation.  Providence Crosby, LCSWA Clinical Social Work [phone]

## 2013-04-14 ENCOUNTER — Non-Acute Institutional Stay (SKILLED_NURSING_FACILITY): Payer: Medicare Other | Admitting: Internal Medicine

## 2013-04-14 ENCOUNTER — Encounter: Payer: Self-pay | Admitting: Internal Medicine

## 2013-04-14 ENCOUNTER — Other Ambulatory Visit: Payer: Self-pay | Admitting: *Deleted

## 2013-04-14 DIAGNOSIS — F411 Generalized anxiety disorder: Secondary | ICD-10-CM

## 2013-04-14 DIAGNOSIS — I10 Essential (primary) hypertension: Secondary | ICD-10-CM | POA: Insufficient documentation

## 2013-04-14 DIAGNOSIS — K219 Gastro-esophageal reflux disease without esophagitis: Secondary | ICD-10-CM

## 2013-04-14 DIAGNOSIS — Z9889 Other specified postprocedural states: Secondary | ICD-10-CM

## 2013-04-14 DIAGNOSIS — R51 Headache: Secondary | ICD-10-CM

## 2013-04-14 DIAGNOSIS — F419 Anxiety disorder, unspecified: Secondary | ICD-10-CM

## 2013-04-14 DIAGNOSIS — Z96659 Presence of unspecified artificial knee joint: Secondary | ICD-10-CM | POA: Insufficient documentation

## 2013-04-14 DIAGNOSIS — R519 Headache, unspecified: Secondary | ICD-10-CM | POA: Insufficient documentation

## 2013-04-14 MED ORDER — CLONAZEPAM 0.5 MG PO TABS: ORAL_TABLET | ORAL | Status: DC

## 2013-04-14 NOTE — Assessment & Plan Note (Addendum)
Pt is on Xarelto and has OT/PT. Pt has oxycodone 20mg  QID at 8,12,4,8 AND dilaudid 2mg  every 4 prn. I discussed this with the pt and told her to ask for the dilaudid in between the oxycodone so that she would have something every 2 hours and every 4 hours at night

## 2013-04-14 NOTE — Assessment & Plan Note (Signed)
Will continue on nexium

## 2013-04-14 NOTE — Progress Notes (Signed)
MRN: [id number] Name: Jessica Lopez  Sex: female Age: 72 y.o. DOB: [birth year]  [facility] #: [facility] Facility/Room: 107 Level Of Care: SNF Provider: Merrilee Seashore D Emergency Contacts: Extended Emergency Contact Information Primary Emergency Contact: Sukup,George Address: [address]          [place], [place] [address] [place] Home Phone: [phone] Relation: Spouse  Code Status: FULL  Allergies: Hydrocodone  Chief Complaint  Patient presents with  . nursing home admission    HPI: Patient is 72 y.o. female who is s/p L knee arthroplasty admitted for PT/OT  Past Medical History  Diagnosis Date  . Osteoarthritis   . Lower back pain   . Chronic constipation   . Headache(784.0)     migraines  . CHF (congestive heart failure)   . Anemia   . Hypertension   . Anxiety   . GERD (gastroesophageal reflux disease)     Past Surgical History  Procedure Laterality Date  . Knee surgery      right  . Abdominal hysterectomy    . Colonoscopy  11/30/2011    QMV:HQIO papilla/internal hemorrhoids.  Rectum and colon appeared otherwise normal  . Total knee arthroplasty Left 04/10/2013    Procedure: LEFT TOTAL KNEE ARTHROPLASTY;  Surgeon: Javier Docker, MD;  Location: [facility];  Service: Orthopedics;  Laterality: Left;      Medication List       This list is accurate as of: 04/14/13  1:25 PM.  Always use your most recent med list.               ALPRAZolam 0.25 MG tablet  Commonly known as:  XANAX  Take 1 tablet (0.25 mg total) by mouth 4 (four) times daily as needed for anxiety.     amLODipine-benazepril 5-20 MG per capsule  Commonly known as:  LOTREL  Take 1 capsule by mouth every morning.     bifidobacterium infantis capsule  Take 1 capsule by mouth daily.     clonazePAM 0.5 MG tablet  Commonly known as:  KLONOPIN  Take one tablet by mouth at bedtime as needed for anxiety/sleep; Take two tablets by mouth at bedtime as needed for anxiety/sleep     DULoxetine 60 MG capsule  Commonly known as:  CYMBALTA  Take 60 mg by mouth every morning.     fexofenadine 180 MG tablet  Commonly known as:  ALLEGRA  Take 180 mg by mouth daily.     fish oil-omega-3 fatty acids 1000 MG capsule  Take 2 g by mouth daily.     folic acid 1 MG tablet  Commonly known as:  FOLVITE  Take 1 mg by mouth daily.     furosemide 40 MG tablet  Commonly known as:  LASIX  Take 40 mg by mouth 2 (two) times daily.     HYDROmorphone 2 MG tablet  Commonly known as:  DILAUDID  Take 1 tablet (2 mg total) by mouth every 4 (four) hours as needed for pain (for severe breakthrough pain).     ibuprofen 800 MG tablet  Commonly known as:  ADVIL,MOTRIN  Take 800 mg by mouth every 8 (eight) hours as needed for pain.     KLOR-CON M20 20 MEQ tablet  Generic drug:  potassium chloride SA  Take 20 mEq by mouth daily.     Linaclotide 290 MCG Caps capsule  Commonly known as:  LINZESS  Take 290 mcg by mouth daily.     NEXIUM 40 MG  capsule  Generic drug:  esomeprazole  Take 40 mg by mouth daily before breakfast.     Oxycodone HCl 20 MG Tabs  Take 1 tablet (20 mg total) by mouth every 4 (four) hours as needed.     rivaroxaban 10 MG Tabs tablet  Commonly known as:  XARELTO  Take 1 tablet (10 mg total) by mouth daily.     rizatriptan 10 MG tablet  Commonly known as:  MAXALT  Take 10 mg by mouth daily as needed for migraine.     tiZANidine 4 MG tablet  Commonly known as:  ZANAFLEX  Take 4 mg by mouth 3 (three) times daily.     vitamin B-12 100 MCG tablet  Commonly known as:  CYANOCOBALAMIN  Take 100 mcg by mouth daily.        Meds ordered this encounter  Medications  . ibuprofen (ADVIL,MOTRIN) 800 MG tablet    Sig: Take 800 mg by mouth every 8 (eight) hours as needed for pain.     There is no immunization history on file for this patient.  History  Substance Use Topics  . Smoking status: Former Smoker -- 0.50 packs/day    Types: Cigarettes  . Smokeless  tobacco: Former Neurosurgeon    Quit date: 10/09/1964     Comment: quit about 35+ years ago  . Alcohol Use: Yes     Comment: very little(wine)     Family history is noncontributory    Review of Systems  DATA OBTAINED: from patient                                   As I was leaving pt's room her daughter walked up and said that she wants to go to another SNF, nearer home. Also Unhappy about pain medication situation. GENERAL:  no fevers, fatigue,pt is in pain , not getting pain medications often enough SKIN: No itching, rash EYES: No eye pain, redness, discharge EARS: No earache, tinnitus, change in hearing NOSE: No congestion, drainage or bleeding  MOUTH/THROAT: No mouth or tooth pain, No sore throat, No difficulty chewing or swallowing  RESPIRATORY: No cough, wheezing, SOB CARDIAC: No chest pain, palpitations, lower extremity edema  GI: No abdominal pain, No N/V/D or constipation, No heartburn or reflux  GU: No dysuria, frequency or urgency, or incontinence  MUSCULOSKELETAL: has L knee pain NEUROLOGIC: no c/o PSYCHIATRIC: No overt anxiety   AMBULATION: pt has long knee immobilizer   Filed Vitals:   04/14/13 1200  BP: 130/70  Pulse: 96  Temp: 98.2 F (36.8 C)  Resp: 18    Physical Exam  GENERAL APPEARANCE: Alert, conversant. Appropriately groomed. No acute distress.  SKIN: No diaphoresis rash, HEAD: Normocephalic, atraumatic  EYES: Conjunctiva/lids clear. Pupils round, reactive. EOMs intact.  EARS: External exam WNL, canals clear. Hearing grossly normal.  NOSE: No deformity or discharge.  MOUTH/THROAT: Lips w/o lesions.   RESPIRATORY: Breathing is even, unlabored. Lung sounds are clear   CARDIOVASCULAR: Heart RRR no murmurs, rubs or gallops. No peripheral edema.  ARTERIAL: radial pulse 2+, DP pulse 1+  VENOUS: No varicosities. No venous stasis skin changes  GASTROINTESTINAL: Abdomen is soft, non-tender, not distended w/ normal bowel sounds. GENITOURINARY: Bladder non tender,  not distended  MUSCULOSKELETAL: L knee in imobilizer NEUROLOGIC: Oriented X3. Cranial nerves 2-12 grossly intact. Moves all extremities no tremor. PSYCHIATRIC: Mood and affect appropriate to situation, no behavioral issues  Patient Active  Problem List   Diagnosis Date Noted  . Hx of total knee arthroplasty   . Hypertension   . Anxiety   . GERD (gastroesophageal reflux disease)   . Headache(784.0)   . Left knee DJD 04/10/2013  . Abnormal weight gain 01/27/2013  . Screening for colon cancer 11/11/2011  . Unspecified constipation 11/11/2011  . Cervical adenopathy 11/11/2011     CBC    Component Value Date/Time   WBC 13.2* 04/13/2013 0500   RBC 3.19* 04/13/2013 0500   HGB 9.0* 04/13/2013 0500   HCT 27.8* 04/13/2013 0500   PLT 214 04/13/2013 0500   MCV 87.1 04/13/2013 0500    CMP     Component Value Date/Time   NA 134* 04/11/2013 0410   K 4.4 04/11/2013 0410   CL 100 04/11/2013 0410   CO2 27 04/11/2013 0410   GLUCOSE 147* 04/11/2013 0410   BUN 11 04/11/2013 0410   CREATININE 0.89 04/11/2013 0410   CALCIUM 8.9 04/11/2013 0410   GFRNONAA 63* 04/11/2013 0410   GFRAA 73* 04/11/2013 0410    Assessment and Plan  Hx of total knee arthroplasty Pt is on Xarelto and has OT/PT. Pt has oxycodone 20mg  QID at 8,12,4,8 AND dilaudid 2mg  every 4 prn. I discussed this with the pt and told her to ask for the dilaudid in between the oxycodone so that she would have something every 2 hours and every 4 hours at night  Hypertension Stable -will continie on Lotrel 5-20, lasix and K+  GERD (gastroesophageal reflux disease) Will continue on nexium  Anxiety Will continue on Xanex, Klonopin, cymbalta  Headache(784.0) Will continue on Maxalt    Margit Hanks, MD

## 2013-04-14 NOTE — Assessment & Plan Note (Signed)
Will continue on Maxalt

## 2013-04-14 NOTE — Assessment & Plan Note (Signed)
Will continue on Xanex, Klonopin, cymbalta

## 2013-04-14 NOTE — Assessment & Plan Note (Signed)
Stable -will continie on Lotrel 5-20, lasix and K+

## 2013-04-14 NOTE — Progress Notes (Signed)
Clinical Social Work Department CLINICAL SOCIAL WORK PLACEMENT NOTE 04/14/2013  Patient:  Jessica, Lopez  Account Number:  [id number] Admit date:  04/10/2013  Clinical Social Worker:  Cori Razor, LCSW  Date/time:  04/11/2013 12:39 PM  Clinical Social Work is seeking post-discharge placement for this patient at the following level of care:   SKILLED NURSING   (*CSW will update this form in Epic as items are completed)   04/11/2013  Patient/family provided with [facility] Department of Clinical Social Work's list of facilities offering this level of care within the geographic area requested by the patient (or if unable, by the patient's family).  04/11/2013  Patient/family informed of their freedom to choose among providers that offer the needed level of care, that participate in Medicare, Medicaid or managed care program needed by the patient, have an available bed and are willing to accept the patient.  04/11/2013  Patient/family informed of [facility]' ownership interest in [facility], as well as of the fact that they are under no obligation to receive care at this facility.  PASARR submitted to EDS on 04/11/2013 PASARR number received from EDS on   FL2 transmitted to all facilities in geographic area requested by pt/family on  04/11/2013 FL2 transmitted to all facilities within larger geographic area on   Patient informed that his/her managed care company has contracts with or will negotiate with  certain facilities, including the following:     Patient/family informed of bed offers received:  04/11/2013 Patient chooses bed at [facility] Physician recommends and patient chooses bed at    Patient to be transferred to [facility] on  04/13/2013 Patient to be transferred to facility by EMS  The following physician request were entered in Epic:   Additional Comments:  Cori Razor LCSW [phone]

## 2013-04-17 ENCOUNTER — Encounter (HOSPITAL_COMMUNITY): Payer: Self-pay | Admitting: *Deleted

## 2013-04-17 ENCOUNTER — Inpatient Hospital Stay (HOSPITAL_COMMUNITY)
Admission: AD | Admit: 2013-04-17 | Discharge: 2013-04-18 | DRG: 556 | Disposition: A | Discharge status: Home Health | Payer: Medicare Other | Source: Other Acute Inpatient Hospital | Attending: Internal Medicine | Admitting: Internal Medicine

## 2013-04-17 DIAGNOSIS — R51 Headache: Secondary | ICD-10-CM

## 2013-04-17 DIAGNOSIS — M545 Low back pain, unspecified: Secondary | ICD-10-CM | POA: Diagnosis present

## 2013-04-17 DIAGNOSIS — M25562 Pain in left knee: Secondary | ICD-10-CM | POA: Diagnosis present

## 2013-04-17 DIAGNOSIS — G43909 Migraine, unspecified, not intractable, without status migrainosus: Secondary | ICD-10-CM | POA: Diagnosis present

## 2013-04-17 DIAGNOSIS — Z23 Encounter for immunization: Secondary | ICD-10-CM

## 2013-04-17 DIAGNOSIS — R590 Localized enlarged lymph nodes: Secondary | ICD-10-CM

## 2013-04-17 DIAGNOSIS — Z7901 Long term (current) use of anticoagulants: Secondary | ICD-10-CM

## 2013-04-17 DIAGNOSIS — R635 Abnormal weight gain: Secondary | ICD-10-CM

## 2013-04-17 DIAGNOSIS — I10 Essential (primary) hypertension: Secondary | ICD-10-CM

## 2013-04-17 DIAGNOSIS — F411 Generalized anxiety disorder: Secondary | ICD-10-CM | POA: Diagnosis present

## 2013-04-17 DIAGNOSIS — Z09 Encounter for follow-up examination after completed treatment for conditions other than malignant neoplasm: Secondary | ICD-10-CM

## 2013-04-17 DIAGNOSIS — M1990 Unspecified osteoarthritis, unspecified site: Secondary | ICD-10-CM | POA: Diagnosis present

## 2013-04-17 DIAGNOSIS — K5900 Constipation, unspecified: Secondary | ICD-10-CM

## 2013-04-17 DIAGNOSIS — Z96652 Presence of left artificial knee joint: Secondary | ICD-10-CM

## 2013-04-17 DIAGNOSIS — I509 Heart failure, unspecified: Secondary | ICD-10-CM | POA: Diagnosis present

## 2013-04-17 DIAGNOSIS — K219 Gastro-esophageal reflux disease without esophagitis: Secondary | ICD-10-CM

## 2013-04-17 DIAGNOSIS — Z79899 Other long term (current) drug therapy: Secondary | ICD-10-CM

## 2013-04-17 DIAGNOSIS — R079 Chest pain, unspecified: Secondary | ICD-10-CM | POA: Diagnosis present

## 2013-04-17 DIAGNOSIS — M1712 Unilateral primary osteoarthritis, left knee: Secondary | ICD-10-CM

## 2013-04-17 DIAGNOSIS — Z87891 Personal history of nicotine dependence: Secondary | ICD-10-CM

## 2013-04-17 DIAGNOSIS — Z1211 Encounter for screening for malignant neoplasm of colon: Secondary | ICD-10-CM

## 2013-04-17 DIAGNOSIS — F419 Anxiety disorder, unspecified: Secondary | ICD-10-CM

## 2013-04-17 DIAGNOSIS — Z96659 Presence of unspecified artificial knee joint: Secondary | ICD-10-CM

## 2013-04-17 DIAGNOSIS — M25569 Pain in unspecified knee: Principal | ICD-10-CM

## 2013-04-17 LAB — COMPREHENSIVE METABOLIC PANEL
Albumin: 2.9 g/dL — ABNORMAL LOW (ref 3.5–5.2)
Alkaline Phosphatase: 105 U/L (ref 39–117)
BUN: 5 mg/dL — ABNORMAL LOW (ref 6–23)
CO2: 21 mEq/L (ref 19–32)
Chloride: 100 mEq/L (ref 96–112)
Creatinine, Ser: 0.73 mg/dL (ref 0.50–1.10)
GFR calc non Af Amer: 83 mL/min — ABNORMAL LOW (ref >90)
Glucose, Bld: 113 mg/dL — ABNORMAL HIGH (ref 70–99)
Potassium: 3.7 mEq/L (ref 3.5–5.1)
Total Bilirubin: 0.7 mg/dL (ref 0.3–1.2)

## 2013-04-17 LAB — CBC
MCV: 86.4 fL (ref 78.0–100.0)
Platelets: 352 10*3/uL (ref 150–400)
RDW: 14.1 % (ref 11.5–15.5)
WBC: 9.4 10*3/uL (ref 4.0–10.5)

## 2013-04-17 LAB — TROPONIN I
Troponin I: <0.3 ng/mL (ref <0.30)
Troponin I: <0.3 ng/mL (ref <0.30)

## 2013-04-17 MED ORDER — AMLODIPINE BESYLATE 5 MG PO TABS
5.0000 mg | ORAL_TABLET | Freq: Every day | ORAL | Status: DC
  Administered 2013-04-17 – 2013-04-18 (×2): 5 mg via ORAL
  Filled 2013-04-17 (×2): qty 1

## 2013-04-17 MED ORDER — MORPHINE SULFATE 4 MG/ML IJ SOLN: 4.0000 mg | INTRAMUSCULAR | Status: DC | PRN

## 2013-04-17 MED ORDER — ALPRAZOLAM 0.25 MG PO TABS
0.2500 mg | ORAL_TABLET | Freq: Four times a day (QID) | ORAL | Status: DC | PRN
Start: 2013-04-17 — End: 2013-04-18
  Administered 2013-04-17 – 2013-04-18 (×3): 0.25 mg via ORAL
  Filled 2013-04-17 (×3): qty 1

## 2013-04-17 MED ORDER — FUROSEMIDE 40 MG PO TABS
40.0000 mg | ORAL_TABLET | Freq: Two times a day (BID) | ORAL | Status: DC
Start: 2013-04-17 — End: 2013-04-18
  Administered 2013-04-17 – 2013-04-18 (×3): 40 mg via ORAL
  Filled 2013-04-17 (×5): qty 1

## 2013-04-17 MED ORDER — POTASSIUM CHLORIDE CRYS ER 20 MEQ PO TBCR
20.0000 meq | EXTENDED_RELEASE_TABLET | Freq: Every day | ORAL | Status: DC
  Administered 2013-04-17 – 2013-04-18 (×2): 20 meq via ORAL
  Filled 2013-04-17 (×2): qty 1

## 2013-04-17 MED ORDER — SODIUM CHLORIDE 0.9 % IV SOLN
1500.0000 mg | Freq: Once | INTRAVENOUS | Status: AC
  Administered 2013-04-17: 1500 mg via INTRAVENOUS
  Filled 2013-04-17: qty 1500

## 2013-04-17 MED ORDER — ACETAMINOPHEN 325 MG PO TABS
650.0000 mg | ORAL_TABLET | Freq: Four times a day (QID) | ORAL | Status: DC | PRN
  Administered 2013-04-18: 650 mg via ORAL
  Filled 2013-04-17: qty 2

## 2013-04-17 MED ORDER — FOLIC ACID 1 MG PO TABS
1.0000 mg | ORAL_TABLET | Freq: Every day | ORAL | Status: DC
  Administered 2013-04-17 – 2013-04-18 (×2): 1 mg via ORAL
  Filled 2013-04-17 (×2): qty 1

## 2013-04-17 MED ORDER — OXYCODONE HCL 5 MG PO TABS
10.0000 mg | ORAL_TABLET | ORAL | Status: DC | PRN
  Administered 2013-04-17 (×3): 10 mg via ORAL
  Administered 2013-04-18: 15 mg via ORAL
  Administered 2013-04-18 (×2): 10 mg via ORAL
  Administered 2013-04-18: 5 mg via ORAL
  Filled 2013-04-17 (×4): qty 2
  Filled 2013-04-17: qty 3
  Filled 2013-04-17 (×3): qty 2

## 2013-04-17 MED ORDER — SODIUM CHLORIDE 0.9 % IJ SOLN
3.0000 mL | Freq: Two times a day (BID) | INTRAMUSCULAR | Status: DC
  Administered 2013-04-17 (×3): 3 mL via INTRAVENOUS

## 2013-04-17 MED ORDER — BENAZEPRIL HCL 20 MG PO TABS
20.0000 mg | ORAL_TABLET | Freq: Every day | ORAL | Status: DC
  Administered 2013-04-17 – 2013-04-18 (×2): 20 mg via ORAL
  Filled 2013-04-17 (×2): qty 1

## 2013-04-17 MED ORDER — LINACLOTIDE 290 MCG PO CAPS
290.0000 ug | ORAL_CAPSULE | Freq: Every day | ORAL | Status: DC
  Administered 2013-04-17 – 2013-04-18 (×2): 290 ug via ORAL
  Filled 2013-04-17 (×2): qty 1

## 2013-04-17 MED ORDER — RIVAROXABAN 10 MG PO TABS
10.0000 mg | ORAL_TABLET | Freq: Every day | ORAL | Status: DC
  Administered 2013-04-17 – 2013-04-18 (×2): 10 mg via ORAL
  Filled 2013-04-17 (×2): qty 1

## 2013-04-17 MED ORDER — BIOTENE DRY MOUTH MT LIQD
15.0000 mL | Freq: Two times a day (BID) | OROMUCOSAL | Status: DC
  Administered 2013-04-17 – 2013-04-18 (×3): 15 mL via OROMUCOSAL

## 2013-04-17 MED ORDER — VITAMIN B-12 100 MCG PO TABS
100.0000 ug | ORAL_TABLET | Freq: Every day | ORAL | Status: DC
  Administered 2013-04-17 – 2013-04-18 (×2): 100 ug via ORAL
  Filled 2013-04-17 (×2): qty 1

## 2013-04-17 MED ORDER — RISAQUAD PO CAPS
1.0000 | ORAL_CAPSULE | Freq: Every day | ORAL | Status: DC
  Administered 2013-04-17 – 2013-04-18 (×2): 1 via ORAL
  Filled 2013-04-17 (×2): qty 1

## 2013-04-17 MED ORDER — PIPERACILLIN-TAZOBACTAM 3.375 G IVPB
3.3750 g | Freq: Three times a day (TID) | INTRAVENOUS | Status: DC
  Administered 2013-04-17: 3.375 g via INTRAVENOUS
  Filled 2013-04-17 (×3): qty 50

## 2013-04-17 MED ORDER — PANTOPRAZOLE SODIUM 40 MG PO TBEC
80.0000 mg | DELAYED_RELEASE_TABLET | Freq: Every day | ORAL | Status: DC
  Administered 2013-04-17 – 2013-04-18 (×2): 80 mg via ORAL
  Filled 2013-04-17 (×2): qty 2

## 2013-04-17 MED ORDER — IBUPROFEN 800 MG PO TABS
800.0000 mg | ORAL_TABLET | Freq: Three times a day (TID) | ORAL | Status: DC | PRN
  Administered 2013-04-17: 800 mg via ORAL
  Filled 2013-04-17 (×2): qty 1

## 2013-04-17 MED ORDER — AMLODIPINE BESY-BENAZEPRIL HCL 5-20 MG PO CAPS: 1.0000 | ORAL_CAPSULE | Freq: Every morning | ORAL | Status: DC

## 2013-04-17 MED ORDER — HYDROMORPHONE HCL PF 1 MG/ML IJ SOLN
1.0000 mg | INTRAMUSCULAR | Status: DC | PRN
  Administered 2013-04-17: 1 mg via INTRAVENOUS
  Filled 2013-04-17: qty 1

## 2013-04-17 MED ORDER — LORATADINE 10 MG PO TABS
10.0000 mg | ORAL_TABLET | Freq: Every day | ORAL | Status: DC
  Administered 2013-04-17 – 2013-04-18 (×2): 10 mg via ORAL
  Filled 2013-04-17 (×2): qty 1

## 2013-04-17 MED ORDER — DULOXETINE HCL 60 MG PO CPEP
60.0000 mg | ORAL_CAPSULE | Freq: Every morning | ORAL | Status: DC
  Administered 2013-04-17 – 2013-04-18 (×2): 60 mg via ORAL
  Filled 2013-04-17 (×2): qty 1

## 2013-04-17 MED ORDER — INFLUENZA VAC SPLIT QUAD 0.5 ML IM SUSP
0.5000 mL | INTRAMUSCULAR | Status: AC
  Administered 2013-04-18: 0.5 mL via INTRAMUSCULAR
  Filled 2013-04-17: qty 0.5

## 2013-04-17 MED ORDER — OXYCODONE HCL 5 MG PO TABS: 20.0000 mg | ORAL_TABLET | ORAL | Status: DC | PRN

## 2013-04-17 MED ORDER — VANCOMYCIN HCL IN DEXTROSE 1-5 GM/200ML-% IV SOLN
1000.0000 mg | Freq: Two times a day (BID) | INTRAVENOUS | Status: DC
  Filled 2013-04-17: qty 200

## 2013-04-17 MED ORDER — TIZANIDINE HCL 4 MG PO TABS
4.0000 mg | ORAL_TABLET | Freq: Three times a day (TID) | ORAL | Status: DC
  Administered 2013-04-17 – 2013-04-18 (×4): 4 mg via ORAL
  Filled 2013-04-17 (×6): qty 1

## 2013-04-17 MED ORDER — OXYCODONE HCL 20 MG PO TABS: 20.0000 mg | ORAL_TABLET | ORAL | Status: DC | PRN

## 2013-04-17 NOTE — Progress Notes (Addendum)
Clinical Social Work Department CLINICAL SOCIAL WORK PLACEMENT NOTE 04/17/2013  Patient:  Jessica Lopez, Jessica Lopez  Account Number:  [id number] Admit date:  04/17/2013  Clinical Social Worker:  Robin Searing  Date/time:  04/17/2013 11:06 AM  Clinical Social Work is seeking post-discharge placement for this patient at the following level of care:   SKILLED NURSING   (*CSW will update this form in Epic as items are completed)   04/17/2013  Patient/family provided with [facility] Department of Clinical Social Work's list of facilities offering this level of care within the geographic area requested by the patient (or if unable, by the patient's family).  04/17/2013  Patient/family informed of their freedom to choose among providers that offer the needed level of care, that participate in Medicare, Medicaid or managed care program needed by the patient, have an available bed and are willing to accept the patient.  04/17/2013  Patient/family informed of [facility]' ownership interest in [facility], as well as of the fact that they are under no obligation to receive care at this facility.  PASARR submitted to EDS on 04/17/2013 PASARR number received from EDS on 04/17/2013  FL2 transmitted to all facilities in geographic area requested by pt/family on  04/17/2013 FL2 transmitted to all facilities within larger geographic area on   Patient informed that his/her managed care company has contracts with or will negotiate with  certain facilities, including the following:     Patient/family informed of bed offers received: 04/18/2013  Patient chooses bed at -- Physician recommends and patient chooses bed at    Patient to be transferred to HOME on 04/18/2013  Patient to be transferred to facility by Home with Daughter  The following physician request were entered in Epic:   Additional Comments: [name], MSW [phone]

## 2013-04-17 NOTE — H&P (Signed)
[facility] History and Physical  Jessica Lopez EAV:[id number] DOB: July 02, 1941 DOA: 04/17/2013  Referring physician: ED PCP: Kirstie Peri, MD   Chief Complaint: Knee pain  HPI: Jessica Lopez is a 72 y.o. female who presents to the [place] ED in [place] with c/o L knee swelling and pain.  Patients symptoms of worsening swelling and pain in the L knee started to worsen today.  She is s/p a TKA of that knee on the 11th of this month by Dr. Shelle Iron.  She presented to the ED with knee pain and some chest pain as well.  Work up at the [facility] ED included a CTA of the chest which was negative for PE or Aortic dissection.  Troponin was negative x1.  Because her surgery was done at [facility] EDP requested [facility].  Ortho surgery wants medicine to admit and will consult on patient regarding knee pain.  Review of Systems: Negative for fever, chills, 12 systems reviewed and otherwise negative.  Past Medical History  Diagnosis Date  . Osteoarthritis   . Lower back pain   . Chronic constipation   . Headache(784.0)     migraines  . CHF (congestive heart failure)   . Anemia   . Hypertension   . Anxiety   . GERD (gastroesophageal reflux disease)    Past Surgical History  Procedure Laterality Date  . Knee surgery      right  . Abdominal hysterectomy    . Colonoscopy  11/30/2011    NWG:NFAO papilla/internal hemorrhoids.  Rectum and colon appeared otherwise normal  . Total knee arthroplasty Left 04/10/2013    Procedure: LEFT TOTAL KNEE ARTHROPLASTY;  Surgeon: Javier Docker, MD;  Location: [facility] ORS;  Service: Orthopedics;  Laterality: Left;   Social History:  reports that she has quit smoking. Her smoking use included Cigarettes. She smoked 0.50 packs per day. She quit smokeless tobacco use about 48 years ago. She reports that  drinks alcohol. She reports that she does not use illicit drugs.   Allergies  Allergen Reactions  . Hydrocodone Itching    Family History  Problem Relation Age of  Onset  . Colon cancer Neg Hx     Prior to Admission medications   Medication Sig Start Date End Date Taking? Authorizing Provider  ALPRAZolam (XANAX) 0.25 MG tablet Take 1 tablet (0.25 mg total) by mouth 4 (four) times daily as needed for anxiety. 04/13/13   Rodolph Bong, MD  amLODipine-benazepril (LOTREL) 5-20 MG per capsule Take 1 capsule by mouth every morning.  12/12/12   Historical Provider, MD  bifidobacterium infantis (ALIGN) capsule Take 1 capsule by mouth daily.    Historical Provider, MD  clonazePAM (KLONOPIN) 0.5 MG tablet Take one tablet by mouth at bedtime as needed for anxiety/sleep; Take two tablets by mouth at bedtime as needed for anxiety/sleep 04/14/13   Tiffany L Reed, DO  DULoxetine (CYMBALTA) 60 MG capsule Take 60 mg by mouth every morning.  01/23/13   Historical Provider, MD  fexofenadine (ALLEGRA) 180 MG tablet Take 180 mg by mouth daily.    Historical Provider, MD  fish oil-omega-3 fatty acids 1000 MG capsule Take 2 g by mouth daily.    Historical Provider, MD  folic acid (FOLVITE) 1 MG tablet Take 1 mg by mouth daily.    Historical Provider, MD  furosemide (LASIX) 40 MG tablet Take 40 mg by mouth 2 (two) times daily.  09/25/11   Historical Provider, MD  HYDROmorphone (DILAUDID) 2 MG tablet Take  1 tablet (2 mg total) by mouth every 4 (four) hours as needed for pain (for severe breakthrough pain). 04/11/13   Dayna Barker. Bissell, PA-C  ibuprofen (ADVIL,MOTRIN) 800 MG tablet Take 800 mg by mouth every 8 (eight) hours as needed for pain.    Historical Provider, MD  KLOR-CON M20 20 MEQ tablet Take 20 mEq by mouth daily.  08/16/11   Historical Provider, MD  Linaclotide (LINZESS) 290 MCG CAPS Take 290 mcg by mouth daily. 01/27/13   Tiffany Kocher, PA-C  NEXIUM 40 MG capsule Take 40 mg by mouth daily before breakfast.  10/23/11   Historical Provider, MD  Oxycodone HCl 20 MG TABS Take 1 tablet (20 mg total) by mouth every 4 (four) hours as needed. 04/13/13   Rodolph Bong, MD   rivaroxaban (XARELTO) 10 MG TABS tablet Take 1 tablet (10 mg total) by mouth daily. 04/10/13   Dayna Barker. Bissell, PA-C  rizatriptan (MAXALT) 10 MG tablet Take 10 mg by mouth daily as needed for migraine.  01/19/13   Historical Provider, MD  tiZANidine (ZANAFLEX) 4 MG tablet Take 4 mg by mouth 3 (three) times daily.  01/03/13   Historical Provider, MD  vitamin B-12 (CYANOCOBALAMIN) 100 MCG tablet Take 100 mcg by mouth daily.    Historical Provider, MD   Physical Exam: Filed Vitals:   04/17/13 0050  BP: 134/62  Pulse: 113  Temp: 99.9 F (37.7 C)  Resp: 18    General:  Some distress, patient in pain Eyes: PEERLA EOMI ENT: mucous membranes moist Neck: supple w/o JVD Cardiovascular: RRR w/o MRG, strong palpable peripheral pulse in LLE foot Respiratory: CTA B Abdomen: soft, nt, nd, bs+ Skin: no rash nor lesion Musculoskeletal: MAE, LLE knee swollen and bruised, no obvious erythema Psychiatric: normal tone and affect Neurologic: AAOx3, grossly non-focal  Labs on Admission:  Basic Metabolic Panel:  Recent Labs Lab 04/11/13 0410  NA 134*  K 4.4  CL 100  CO2 27  GLUCOSE 147*  BUN 11  CREATININE 0.89  CALCIUM 8.9   Liver Function Tests: No results found for this basename: AST, ALT, ALKPHOS, BILITOT, PROT, ALBUMIN,  in the last 168 hours No results found for this basename: LIPASE, AMYLASE,  in the last 168 hours No results found for this basename: AMMONIA,  in the last 168 hours CBC:  Recent Labs Lab 04/11/13 0410 04/12/13 0432 04/13/13 0500  WBC 13.9* 14.0* 13.2*  HGB 11.1* 9.8* 9.0*  HCT 33.4* 29.8* 27.8*  MCV 85.9 86.6 87.1  PLT 220 189 214   Cardiac Enzymes: No results found for this basename: CKTOTAL, CKMB, CKMBINDEX, TROPONINI,  in the last 168 hours  BNP (last 3 results) No results found for this basename: PROBNP,  in the last 8760 hours CBG: No results found for this basename: GLUCAP,  in the last 168 hours  Radiological Exams on Admission: No results  found.  EKG: Independently reviewed.  Assessment/Plan Principal Problem:   Left knee pain Active Problems:   Hx of total knee arthroplasty   Chest pain   1. Left knee pain - DDx includes 1. Post op pain - treating with IV dilaudid PRN 2. Knee joint infection - patient did have temperature of 99.9 here and is tachycardic, no WBC elevation, no joint erythema, (just ecchymosis and edema) received zosyn in ED, will treat with empiric zosyn and vanc here on floor until surgery has a chance to further evaluate in the morning. 3. DVT - patient is on Xarelto  already for DVT prevention post op, have ordered venous duplex of the LLE for the morning. 2. Chest pain - ordering serial troponins, first was negative in the ED and patients CP resolved after NTG.  ED evaluation also negative for dissection and PE as well.    Code Status: Full Code (must indicate code status--if unknown or must be presumed, indicate so) Family Communication: Spoke with family at bedside (indicate person spoken with, if applicable, with phone number if by telephone) Disposition Plan: Admit to inpatient (indicate anticipated LOS)  Time spent: 70 min  GARDNER, JARED M. [facility] Pager [phone]  If 7PM-7AM, please contact night-coverage [email or web address] Password [email or web address] 04/17/2013, 1:53 AM

## 2013-04-17 NOTE — Progress Notes (Signed)
PT Cancellation Note  Patient Details Name: Jessica Lopez MRN: [id number] DOB: January 01, 1941   Cancelled Treatment:    Reason Eval/Treat Not Completed: Other (comment) (await ortho consult on pt with recent TKA 9/11)   Toney Sang [facility] 04/17/2013, 1:38 PM Delaney Meigs, PT [phone]

## 2013-04-17 NOTE — Consult Note (Signed)
Reason for Consult :Knee pain Referring Physician: EDP  Jessica Lopez is an 72 y.o. female.  HPI: S/P TKR 10 days. Doing well until transferred to [facility] from rehab and given no pain medication.  Past Medical History  Diagnosis Date  . Osteoarthritis   . Lower back pain   . Chronic constipation   . Headache(784.0)     migraines  . CHF (congestive heart failure)   . Anemia   . Hypertension   . Anxiety   . GERD (gastroesophageal reflux disease)     Past Surgical History  Procedure Laterality Date  . Knee surgery      right  . Abdominal hysterectomy    . Colonoscopy  11/30/2011    VWU:JWJX papilla/internal hemorrhoids.  Rectum and colon appeared otherwise normal  . Total knee arthroplasty Left 04/10/2013    Procedure: LEFT TOTAL KNEE ARTHROPLASTY;  Surgeon: Javier Docker, MD;  Location: [facility];  Service: Orthopedics;  Laterality: Left;    Family History  Problem Relation Age of Onset  . Colon cancer Neg Hx     Social History:  reports that she has quit smoking. Her smoking use included Cigarettes. She smoked 0.50 packs per day. She quit smokeless tobacco use about 48 years ago. She reports that  drinks alcohol. She reports that she does not use illicit drugs.  Allergies:  Allergies  Allergen Reactions  . Hydrocodone Itching    Medications: I have reviewed the patient's current medications.  Results for orders placed during the hospital encounter of 04/17/13 (from the past 48 hour(s))  COMPREHENSIVE METABOLIC PANEL     Status: Abnormal   Collection Time    04/17/13  2:55 AM      Result Value Range   Sodium 136  135 - 145 mEq/L   Potassium 3.7  3.5 - 5.1 mEq/L   Chloride 100  96 - 112 mEq/L   CO2 21  19 - 32 mEq/L   Glucose, Bld 113 (*) 70 - 99 mg/dL   BUN 5 (*) 6 - 23 mg/dL   Creatinine, Ser 9.14  0.50 - 1.10 mg/dL   Calcium 8.9  8.4 - 78.2 mg/dL   Total Protein 6.8  6.0 - 8.3 g/dL   Albumin 2.9 (*) 3.5 - 5.2 g/dL   AST 24  0 - 37 U/L   ALT 18  0 - 35 U/L    Alkaline Phosphatase 105  39 - 117 U/L   Total Bilirubin 0.7  0.3 - 1.2 mg/dL   GFR calc non Af Amer 83 (*) >90 mL/min   GFR calc Af Amer >90  >90 mL/min   Comment: (NOTE)     The eGFR has been calculated using the CKD EPI equation.     This calculation has not been validated in all clinical situations.     eGFR's persistently <90 mL/min signify possible Chronic Kidney     Disease.  CBC     Status: Abnormal   Collection Time    04/17/13  2:55 AM      Result Value Range   WBC 9.4  4.0 - 10.5 K/uL   RBC 3.17 (*) 3.87 - 5.11 MIL/uL   Hemoglobin 9.3 (*) 12.0 - 15.0 g/dL   HCT 95.6 (*) 21.3 - 08.6 %   MCV 86.4  78.0 - 100.0 fL   MCH 29.3  26.0 - 34.0 pg   MCHC 33.9  30.0 - 36.0 g/dL   RDW 57.8  46.9 - 62.9 %  Platelets 352  150 - 400 K/uL  TROPONIN I     Status: None   Collection Time    04/17/13  2:55 AM      Result Value Range   Troponin I <0.30  <0.30 ng/mL   Comment:            Due to the release kinetics of cTnI,     a negative result within the first hours     of the onset of symptoms does not rule out     myocardial infarction with certainty.     If myocardial infarction is still suspected,     repeat the test at appropriate intervals.  TROPONIN I     Status: None   Collection Time    04/17/13  9:20 AM      Result Value Range   Troponin I <0.30  <0.30 ng/mL   Comment:            Due to the release kinetics of cTnI,     a negative result within the first hours     of the onset of symptoms does not rule out     myocardial infarction with certainty.     If myocardial infarction is still suspected,     repeat the test at appropriate intervals.    No results found.  Review of Systems  Musculoskeletal: Positive for joint pain.  All other systems reviewed and are negative.   Blood pressure 122/54, pulse 113, temperature 98.6 F (37 C), temperature source Oral, resp. rate 16, height 5\' 6"  (1.676 m), weight 101.8 kg (224 lb 6.9 oz), SpO2 94.00%. Physical Exam   Constitutional: She is oriented to person, place, and time. She appears well-developed.  HENT:  Head: Normocephalic.  Eyes: Pupils are equal, round, and reactive to light.  Neck: Normal range of motion.  Cardiovascular: Normal rate.   Respiratory: Effort normal.  GI: Soft.  Musculoskeletal:  No erythema. No drainage No DVT ROM pain moderate NVI.  Neurological: She is alert and oriented to person, place, and time.  Skin: Skin is warm and dry.    Assessment/Plan: Pain secondary to abrupt  Withdrawal of pain medications at [facility]. No sgin of infection. Recommend D/C Ab. Titrate pain meds to appropriate level D/C to alternate rehab facility. Discussed with SW and MD.  Jene Every C 04/17/2013, 1:51 PM

## 2013-04-17 NOTE — Progress Notes (Signed)
UR Completed Tonnya Garbett Graves-Bigelow, RN,BSN [phone]  

## 2013-04-17 NOTE — Progress Notes (Signed)
Patient admitted early this AM by Dr. Julian Reil- awaiting ortho consult.  Appears that when patient went to rehab in [place] was not given pain medications.  Most likely pain is from that- does not appear to be infected. Consult social services- needs snf placement  Jessica Lopez

## 2013-04-17 NOTE — Progress Notes (Signed)
ANTIBIOTIC CONSULT NOTE - INITIAL  Pharmacy Consult for vancomycin and zosyn Indication: possible knee infection s/p L knee arthroplasty  Allergies  Allergen Reactions  . Hydrocodone Itching    Patient Measurements: Height: 5\' 6"  (167.6 cm) Weight: 224 lb 6.9 oz (101.8 kg) IBW/kg (Calculated) : 59.3   Vital Signs: Temp: 99.9 F (37.7 C) (09/18 0050) Temp src: Oral (09/18 0050) BP: 134/62 mmHg (09/18 0050) Pulse Rate: 113 (09/18 0050) Intake/Output from previous day:   Intake/Output from this shift:    Labs: No results found for this basename: WBC, HGB, PLT, LABCREA, CREATININE,  in the last 72 hours Estimated Creatinine Clearance: 68.8 ml/min (by C-G formula based on Cr of 0.89). No results found for this basename: VANCOTROUGH, Leodis Binet, VANCORANDOM, GENTTROUGH, GENTPEAK, GENTRANDOM, TOBRATROUGH, TOBRAPEAK, TOBRARND, AMIKACINPEAK, AMIKACINTROU, AMIKACIN,  in the last 72 hours   Microbiology: Recent Results (from the past 720 hour(s))  SURGICAL PCR SCREEN     Status: Abnormal   Collection Time    04/07/13  4:21 PM      Result Value Range Status   MRSA, PCR NEGATIVE  NEGATIVE Final   Staphylococcus aureus POSITIVE (*) NEGATIVE Final   Comment:            The Xpert SA Assay (FDA     approved for NASAL specimens     in patients over 27 years of age),     is one component of     a comprehensive surveillance     program.  Test performance has     been validated by [employer] for patients greater     than or equal to 55 year old.     It is not intended     to diagnose infection nor to     guide or monitor treatment.    Medical History: Past Medical History  Diagnosis Date  . Osteoarthritis   . Lower back pain   . Chronic constipation   . Headache(784.0)     migraines  . CHF (congestive heart failure)   . Anemia   . Hypertension   . Anxiety   . GERD (gastroesophageal reflux disease)     Medications:  Prescriptions prior to admission  Medication  Sig Dispense Refill  . ALPRAZolam (XANAX) 0.25 MG tablet Take 1 tablet (0.25 mg total) by mouth 4 (four) times daily as needed for anxiety.  20 tablet  0  . amLODipine-benazepril (LOTREL) 5-20 MG per capsule Take 1 capsule by mouth every morning.       . bifidobacterium infantis (ALIGN) capsule Take 1 capsule by mouth daily.      . clonazePAM (KLONOPIN) 0.5 MG tablet Take one tablet by mouth at bedtime as needed for anxiety/sleep; Take two tablets by mouth at bedtime as needed for anxiety/sleep  90 tablet  5  . DULoxetine (CYMBALTA) 60 MG capsule Take 60 mg by mouth every morning.       . fexofenadine (ALLEGRA) 180 MG tablet Take 180 mg by mouth daily.      . fish oil-omega-3 fatty acids 1000 MG capsule Take 2 g by mouth daily.      . folic acid (FOLVITE) 1 MG tablet Take 1 mg by mouth daily.      . furosemide (LASIX) 40 MG tablet Take 40 mg by mouth 2 (two) times daily.       Marland Kitchen HYDROmorphone (DILAUDID) 2 MG tablet Take 1 tablet (2 mg total) by mouth every 4 (four) hours  as needed for pain (for severe breakthrough pain).  40 tablet  0  . ibuprofen (ADVIL,MOTRIN) 800 MG tablet Take 800 mg by mouth every 8 (eight) hours as needed for pain.      Marland Kitchen KLOR-CON M20 20 MEQ tablet Take 20 mEq by mouth daily.       . Linaclotide (LINZESS) 290 MCG CAPS Take 290 mcg by mouth daily.  30 capsule  11  . NEXIUM 40 MG capsule Take 40 mg by mouth daily before breakfast.       . Oxycodone HCl 20 MG TABS Take 1 tablet (20 mg total) by mouth every 4 (four) hours as needed.  20 tablet  0  . rivaroxaban (XARELTO) 10 MG TABS tablet Take 1 tablet (10 mg total) by mouth daily.  14 tablet  0  . rizatriptan (MAXALT) 10 MG tablet Take 10 mg by mouth daily as needed for migraine.       Marland Kitchen tiZANidine (ZANAFLEX) 4 MG tablet Take 4 mg by mouth 3 (three) times daily.       . vitamin B-12 (CYANOCOBALAMIN) 100 MCG tablet Take 100 mcg by mouth daily.       Assessment: 72 yo lady to start broad spectrum antibiotics to r/o knee  infection.  She recevied zosyn at [place] at ~20:00.  Goal of Therapy:  Vancomycin trough level 15-20 mcg/ml  Plan:  Zosyn 3.375 gm IV q8 hours. Vancomycin 1500 mg IV X 1 then 1 gm IV q12 hours. F/u renal function, cultures and clinical course  Woodfin Ganja 04/17/2013,2:03 AM

## 2013-04-17 NOTE — Progress Notes (Signed)
*  Preliminary Results* Left lower extremity venous duplex completed. Study was technically limited due to poor patient cooperation. Visualized veins of left lower extremity are negative for deep vein thrombosis. There is no evidence of left Baker's cyst.  04/17/2013 11:52 AM  Gertie Fey, RVT, RDCS, RDMS

## 2013-04-17 NOTE — Progress Notes (Signed)
Clinical Social Work Department BRIEF PSYCHOSOCIAL ASSESSMENT 04/17/2013  Patient:  Jessica Lopez, Jessica Lopez     Account Number:  [id number]     Admit date:  04/17/2013  Clinical Social Worker:  Robin Searing  Date/Time:  04/17/2013 10:58 AM  Referred by:  Physician  Date Referred:  04/17/2013 Referred for  SNF Placement   Other Referral:   Interview type:  Family Other interview type:   GRANDSON- [name] AT BEDSIDE    PSYCHOSOCIAL DATA Living Status:  FACILITY [name] Level of care:  Skilled Nursing Facility Primary support name:  [name], GRANDSON Primary support relationship to patient:  FAMILY Degree of support available:   GOOD    CURRENT CONCERNS Current Concerns  Post-Acute Placement   Other Concerns:    SOCIAL WORK ASSESSMENT / PLAN Met with patient and her grandson, [name], at bedside- patient was placed at [facility] SNF at time of last d/c and was only there for 4 days- family coordinated a move to [facility] SNF in [place], [place] to be closer to home/family-  Per grandson, the SNF in [place] was not suitable and they would like to pursue other SNF's at d/c- I have discussed process for SNF Search and advised them they will have to select from bed offers that are available at time of d/c- (per MD, tomorrow is d/c day).  Family understands and also inquired about taking her home at d/c- CSW discussed HH option and limitations due to intermittant visits as well as making them aware family would need to be there to provide additonal home support. MD has ordered PT eval as well-   Assessment/plan status:  Other - See comment Other assessment/ plan:   CSW will refax FL2 out to [place] and [facility] area SNF's and report to family offers for bed selection.   Information/referral to community resources:   SNF  [facility]  [facility]S    PATIENT'S/FAMILY'S RESPONSE TO PLAN OF CARE: Patient and family appear to be uncertain if they want  SNF or home with [facility] at d/c- will proceed with SNF search and provide offers to them for final determination-      [name], MSW [phone]

## 2013-04-18 DIAGNOSIS — I10 Essential (primary) hypertension: Secondary | ICD-10-CM

## 2013-04-18 MED ORDER — METOPROLOL TARTRATE 25 MG PO TABS
25.0000 mg | ORAL_TABLET | Freq: Two times a day (BID) | ORAL | Status: DC
  Administered 2013-04-18: 25 mg via ORAL
  Filled 2013-04-18 (×4): qty 1

## 2013-04-18 MED ORDER — RIVAROXABAN 10 MG PO TABS: 10.0000 mg | ORAL_TABLET | Freq: Every day | ORAL | Status: DC

## 2013-04-18 MED ORDER — FUROSEMIDE 40 MG PO TABS: 40.0000 mg | ORAL_TABLET | Freq: Two times a day (BID) | ORAL | Status: DC

## 2013-04-18 MED ORDER — OXYCODONE HCL 10 MG PO TABS: 10.0000 mg | ORAL_TABLET | ORAL | Status: DC | PRN

## 2013-04-18 MED ORDER — CLONAZEPAM 0.5 MG PO TABS: ORAL_TABLET | ORAL | Status: AC

## 2013-04-18 MED ORDER — METOPROLOL TARTRATE 25 MG PO TABS: 25.0000 mg | ORAL_TABLET | Freq: Two times a day (BID) | ORAL | Status: AC

## 2013-04-18 MED ORDER — METOPROLOL TARTRATE 25 MG PO TABS: 25.0000 mg | ORAL_TABLET | Freq: Two times a day (BID) | ORAL | Status: DC

## 2013-04-18 NOTE — Progress Notes (Signed)
CSW (Clinical Child psychotherapist) informed by PT that pt is more appropriate for [facility] than SNF. CSW spoke with pt and pt family. They are agreeable with HH if that is what is being recommended. RN CM Steward Drone aware that pt will need to be set up with [facility] CSW signing off.  Omelia Marquart, LCSWA [phone]

## 2013-04-18 NOTE — Discharge Summary (Signed)
Physician Discharge Summary  FREDRIKA CANBY JXB:[id number] DOB: 06/07/1941 DOA: 04/17/2013  PCP: Kirstie Peri, MD  Admit date: 04/17/2013 Discharge date: 04/19/2013  Time spent: 35 minutes  Recommendations for Outpatient Follow-up:  1. D/c xarelto once ambulatory 2. CBc, BMp 1 week  Discharge Diagnoses:  Principal Problem:   Left knee pain Active Problems:   Hx of total knee arthroplasty   Chest pain   Discharge Condition: improved  Diet recommendation: cardiac  Filed Weights   04/17/13 0050  Weight: 101.8 kg (224 lb 6.9 oz)    History of present illness:  Jessica Lopez is a 72 y.o. female who presents to the [place] ED in [place] with c/o L knee swelling and pain. Patients symptoms of worsening swelling and pain in the L knee started to worsen today. She is s/p a TKA of that knee on the 11th of this month by Dr. Shelle Iron. She presented to the ED with knee pain and some chest pain as well.  Work up at the [facility] ED included a CTA of the chest which was negative for PE or Aortic dissection. Troponin was negative x1. Because her surgery was done at [facility]. Ortho surgery wants medicine to admit and will consult on patient regarding knee pain.   Hospital Course:  Pain secondary to abrupt Withdrawal of pain medications at [facility]. No sgin of infection.  Recommend D/C Ab. Titrate pain meds to appropriate level -ortho evaluated -pT recommends H/H- family wishes to take home  Chest pain -CE neg -tele no arrythmias  Procedures:    Consultations:  ortho  Discharge Exam:   General: A+Ox3, Nad Cardiovascular: rrr Respiratory: clear anterior  Discharge Instructions      Discharge Orders   Future Orders Complete By Expires   Diet - low sodium heart healthy  As directed    Increase activity slowly  As directed        Medication List    STOP taking these medications       HYDROmorphone 2 MG tablet  Commonly known as:  DILAUDID     ibuprofen 800 MG tablet  Commonly known as:  ADVIL,MOTRIN     rizatriptan 10 MG tablet  Commonly known as:  MAXALT      TAKE these medications       ALPRAZolam 0.25 MG tablet  Commonly known as:  XANAX  Take 1 tablet (0.25 mg total) by mouth 4 (four) times daily as needed for anxiety.     amLODipine-benazepril 5-20 MG per capsule  Commonly known as:  LOTREL  Take 1 capsule by mouth every morning.     bifidobacterium infantis capsule  Take 1 capsule by mouth daily.     clonazePAM 0.5 MG tablet  Commonly known as:  KLONOPIN  Take one tablet by mouth at bedtime as needed for anxiety/sleep; Take two tablets by mouth at bedtime as needed for anxiety/sleep     DULoxetine 60 MG capsule  Commonly known as:  CYMBALTA  Take 60 mg by mouth every morning.     fexofenadine 180 MG tablet  Commonly known as:  ALLEGRA  Take 180 mg by mouth daily.     fish oil-omega-3 fatty acids 1000 MG capsule  Take 2 g by mouth daily.     folic acid 1 MG tablet  Commonly known as:  FOLVITE  Take 1 mg by mouth daily.     furosemide 40 MG tablet  Commonly known as:  LASIX  Take 1  tablet (40 mg total) by mouth 2 (two) times daily.     KLOR-CON M20 20 MEQ tablet  Generic drug:  potassium chloride SA  Take 20 mEq by mouth daily.     Linaclotide 290 MCG Caps capsule  Commonly known as:  LINZESS  Take 290 mcg by mouth daily.     metoprolol tartrate 25 MG tablet  Commonly known as:  LOPRESSOR  Take 1 tablet (25 mg total) by mouth 2 (two) times daily.     NEXIUM 40 MG capsule  Generic drug:  esomeprazole  Take 40 mg by mouth daily before breakfast.     Oxycodone HCl 10 MG Tabs  Take 1-1.5 tablets (10-15 mg total) by mouth every 4 (four) hours as needed.     rivaroxaban 10 MG Tabs tablet  Commonly known as:  XARELTO  Take 1 tablet (10 mg total) by mouth daily. Can stop once abulatory     tiZANidine 4 MG tablet  Commonly known as:  ZANAFLEX  Take 4 mg by mouth 3 (three) times daily.      vitamin B-12 100 MCG tablet  Commonly known as:  CYANOCOBALAMIN  Take 100 mcg by mouth daily.       Allergies  Allergen Reactions  . Hydrocodone Itching   Follow-up Information   Follow up with [facility], MD In 1 week.   Specialty:  Internal Medicine   Contact information:   [address]  [place] [address] [phone]       Please follow up. (ortho as previously directed)        The results of significant diagnostics from this hospitalization (including imaging, microbiology, ancillary and laboratory) are listed below for reference.    Significant Diagnostic Studies: Dg Chest 2 View  04/07/2013   *RADIOLOGY REPORT*  Clinical Data: Preoperative evaluation for left total knee arthroplasty, history hypertension, smoking, CHF  CHEST - 2 VIEW  Comparison: 02/28/2005  Findings: Normal heart size and pulmonary vascularity. Calcified tortuous aorta. Linear scarring left upper lobe. Lungs otherwise clear. No pleural effusion or pneumothorax. Bones unremarkable.  IMPRESSION: Linear left upper lobe scarring.   Original Report Authenticated By: Ulyses Southward, M.D.   Dg Knee 1-2 Views Left  04/07/2013   *RADIOLOGY REPORT*  Clinical Data: Degenerative joint disease, preoperative evaluation for total knee replacement  LEFT KNEE - 1-2 VIEW  Comparison: None  Findings: Tricompartmental total osteoarthritic changes with joint space narrowing and marginal spur formation. Bones appear demineralized. Minimal lateral subluxation of tibia. No acute fracture, dislocation or bone destruction. No knee joint effusion.  IMPRESSION: Osteoarthritic changes left knee.   Original Report Authenticated By: Ulyses Southward, M.D.   Dg Knee Left Port  04/10/2013   CLINICAL DATA:  Post left knee replacement  EXAM: PORTABLE LEFT KNEE - 1-2 VIEW  COMPARISON:  04/07/2013  FINDINGS: Two views of left knee submitted. There is left knee prosthesis in anatomic alignment. Postsurgical changes are noted with midline skin staples. A  postsurgical drain in place is noted. Small amount of periarticular soft tissue air.  IMPRESSION: Left knee prosthesis in anatomic alignment. Postsurgical changes are noted.   Electronically Signed   By: Natasha Mead   On: 04/10/2013 14:31    Microbiology: Recent Results (from the past 240 hour(s))  CULTURE, BLOOD (ROUTINE X 2)     Status: None   Collection Time    04/17/13  2:55 AM      Result Value Range Status   Specimen Description BLOOD RIGHT HAND  Final   Special Requests BOTTLES DRAWN AEROBIC ONLY 5CC   Final   Culture  Setup Time     Final   Value: 04/17/2013 09:29     Performed at [facility]   Culture     Final   Value:        BLOOD CULTURE RECEIVED NO GROWTH TO DATE CULTURE WILL BE HELD FOR 5 DAYS BEFORE ISSUING A FINAL NEGATIVE REPORT     Performed at [facility]   Report Status PENDING   Incomplete  CULTURE, BLOOD (ROUTINE X 2)     Status: None   Collection Time    04/17/13  3:00 AM      Result Value Range Status   Specimen Description Blood   Final   Special Requests NONE   Final   Culture  Setup Time     Final   Value: 04/17/2013 09:29     Performed at [facility]   Culture     Final   Value:        BLOOD CULTURE RECEIVED NO GROWTH TO DATE CULTURE WILL BE HELD FOR 5 DAYS BEFORE ISSUING A FINAL NEGATIVE REPORT     Performed at [facility]   Report Status PENDING   Incomplete     Labs: Basic Metabolic Panel:  Recent Labs Lab 04/17/13 0255  NA 136  K 3.7  CL 100  CO2 21  GLUCOSE 113*  BUN 5*  CREATININE 0.73  CALCIUM 8.9   Liver Function Tests:  Recent Labs Lab 04/17/13 0255  AST 24  ALT 18  ALKPHOS 105  BILITOT 0.7  PROT 6.8  ALBUMIN 2.9*   No results found for this basename: LIPASE, AMYLASE,  in the last 168 hours No results found for this basename: AMMONIA,  in the last 168 hours CBC:  Recent Labs Lab 04/13/13 0500 04/17/13 0255  WBC 13.2* 9.4  HGB 9.0* 9.3*  HCT 27.8* 27.4*  MCV 87.1 86.4  PLT  214 352   Cardiac Enzymes:  Recent Labs Lab 04/17/13 0255 04/17/13 0920 04/17/13 1509  TROPONINI <0.30 <0.30 <0.30   BNP: BNP (last 3 results) No results found for this basename: PROBNP,  in the last 8760 hours CBG: No results found for this basename: GLUCAP,  in the last 168 hours     Signed:  Benjamine Mola, Traci Plemons  [facility] 04/19/2013, 11:06 AM

## 2013-04-18 NOTE — Care Management Note (Signed)
    Page 1 of 2   04/18/2013     12:01:19 PM   CARE MANAGEMENT NOTE 04/18/2013  Patient:  Jessica Lopez, Jessica Lopez   Account Number:  [id number]  Date Initiated:  04/18/2013  Documentation initiated by:  GRAVES-BIGELOW,Zona Pedro  Subjective/Objective Assessment:   Pt admitted for L knee pain. Pt post op  L TKR 9/11. Pt and husband agreeable to Home with [facility] services. CM did call husband and he is agreeable at this time. Pt will have supervision of husband and daughter.     Action/Plan:   Pt will RW delivered to room before d/c.   Anticipated DC Date:  04/18/2013   Anticipated DC Plan:  HOME W HOME HEALTH SERVICES      DC Planning Services  CM consult      [facility] Choice  HOME HEALTH  DURABLE MEDICAL EQUIPMENT   Choice offered to / List presented to:  C-3 Spouse   DME arranged  Levan Hurst      DME agency  [facility]     [facility] arranged  HH-1 RN  HH-10 DISEASE MANAGEMENT  HH-2 PT  HH-6 SOCIAL WORKER      HH agency  OTHER - SEE NOTE   Status of service:  Completed, signed off Medicare Important Message given?   (If response is "NO", the following Medicare IM given date fields will be blank) Date Medicare IM given:   Date Additional Medicare IM given:    Discharge Disposition:  HOME W HOME HEALTH SERVICES  Per UR Regulation:  Reviewed for med. necessity/level of care/duration of stay  If discussed at Long Length of Stay Meetings, dates discussed:    Comments:  04-18-13 [address], [place] [phone] [facility] will provide HHPT, RN & Aide. CM did place a call out to the MD to mak her aware of change of plan. CM will continue to f/u. Will need orders for services.

## 2013-04-18 NOTE — Evaluation (Signed)
Physical Therapy Evaluation Patient Details Name: Jessica Lopez MRN: [id number] DOB: 22-May-1941 Today's Date: 04/18/2013 Time: 0941-1010 PT Time Calculation (min): 29 min  PT Assessment / Plan / Recommendation History of Present Illness  Pt with L TKR 9/11 and readmitted with left knee pain after not receiving meds at SNF  Clinical Impression  Pt standing in bathroom on arrival without UE assist and able to completely perform pericare. Pt moving well with knee immobilizer discontinued due to SLR. Pt and grandson educated for pt progress and recommend HHPT due to pt function. Pt continues to have limited strength and ROM as well as below deficits and will benefit from acute therapy to maximize function and independence. Will follow. HR did elevate to 157 with gait with increased pain but returned to 118 as soon as sitting, RN aware. Pt reported SOB with tachycardia.    PT Assessment  Patient needs continued PT services    Follow Up Recommendations  Home health PT;Supervision - Intermittent    Does the patient have the potential to tolerate intense rehabilitation      Barriers to Discharge Decreased caregiver support      Equipment Recommendations  Rolling walker with 5" wheels    Recommendations for Other Services     Frequency Min 5X/week    Precautions / Restrictions Precautions Precautions: Knee Restrictions Weight Bearing Restrictions: Yes LLE Weight Bearing: Weight bearing as tolerated   Pertinent Vitals/Pain 8/10 LLE      Mobility  Bed Mobility Bed Mobility: Not assessed Transfers Sit to Stand: 6: Modified independent (Device/Increase time);From chair/3-in-1 Stand to Sit: 6: Modified independent (Device/Increase time);To chair/3-in-1 Ambulation/Gait Ambulation/Gait Assistance: 5: Supervision Ambulation Distance (Feet): 400 Feet Assistive device: Rolling walker Ambulation/Gait Assistance Details: cues for position in RW Gait Pattern: Step-through  pattern;Decreased stance time - left Gait velocity: decreased Stairs: Yes Stairs Assistance: 5: Supervision Stairs Assistance Details (indicate cue type and reason): cueing for sequence Stair Management Technique: Step to pattern;Forwards Number of Stairs: 3    Exercises Total Joint Exercises Heel Slides: AAROM;Left;10 reps;Seated Straight Leg Raises: AROM;Left;10 reps;Seated Long Arc Quad: AROM;Left;10 reps;Seated   PT Diagnosis: Difficulty walking;Acute pain  PT Problem List: Decreased strength;Decreased range of motion;Decreased activity tolerance;Decreased mobility;Cardiopulmonary status limiting activity;Pain PT Treatment Interventions: Gait training;DME instruction;Functional mobility training;Therapeutic activities;Therapeutic exercise;Patient/family education     PT Goals(Current goals can be found in the care plan section) Acute Rehab PT Goals Patient Stated Goal: be able to be ok at home PT Goal Formulation: With patient Time For Goal Achievement: 05/02/13 Potential to Achieve Goals: Good  Visit Information  Last PT Received On: 04/18/13 Assistance Needed: +1 History of Present Illness: Pt with L TKR 9/11 and readmitted with left knee pain after not receiving meds at SNF       Prior Functioning  Home Living Family/patient expects to be discharged to:: Private residence Living Arrangements: Spouse/significant other Available Help at Discharge: Family;Available PRN/intermittently Type of Home: House Home Access: Stairs to enter [employer] of Steps: 3 Entrance Stairs-Rails: Can reach both;Right;Left Home Layout: One level Home Equipment: Walker - 2 wheels Prior Function Level of Independence: Independent Communication Communication: No difficulties    Cognition  Cognition Arousal/Alertness: Awake/alert Behavior During Therapy: WFL for tasks assessed/performed Overall Cognitive Status: Within Functional Limits for tasks assessed     Extremity/Trunk Assessment Upper Extremity Assessment Upper Extremity Assessment: Overall WFL for tasks assessed Lower Extremity Assessment Lower Extremity Assessment: RLE deficits/detail;LLE deficits/detail RLE Deficits / Details: Strength WFL with AROM limited to  grossly 70 flex LLE Deficits / Details: AAROM to grossly 90 degrees strength 3/5 Cervical / Trunk Assessment Cervical / Trunk Assessment: Normal   Balance    End of Session PT - End of Session Activity Tolerance: Patient tolerated treatment well Patient left: in chair;with call bell/phone within reach;with family/visitor present Nurse Communication: Mobility status;Patient requests pain meds CPM Left Knee CPM Left Knee: Off  GP     Toney Sang Beth 04/18/2013, 10:31 AM Delaney Meigs, PT [phone]

## 2013-04-18 NOTE — Plan of Care (Signed)
Problem: Phase II Progression Outcomes Goal: Obtain order to discontinue catheter if appropriate Outcome: Not Progressing Unable to DC foley 2nd to retention

## 2013-04-23 LAB — CULTURE, BLOOD (ROUTINE X 2): Culture: NO GROWTH

## 2013-08-01 NOTE — Progress Notes (Signed)
REVIEWED.  

## 2014-03-25 ENCOUNTER — Non-Acute Institutional Stay (INDEPENDENT_AMBULATORY_CARE_PROVIDER_SITE_OTHER): Payer: Medicare Other | Admitting: Family Medicine

## 2014-03-25 DIAGNOSIS — Z593 Problems related to living in residential institution: Secondary | ICD-10-CM

## 2014-03-25 DIAGNOSIS — Z789 Other specified health status: Secondary | ICD-10-CM

## 2014-03-25 NOTE — Progress Notes (Signed)
Patient ID: Jessica Lopez, female   DOB: 31-May-1941, 73 y.o.   MRN: [id number] Opened note to change NH location to [facility] as they have previously cared for her.

## 2014-10-24 IMAGING — CR DG CHEST 2V
2 series · 2 of 2 positions shown · non-contrast
Comparison: 02/28/2005

CLINICAL DATA: Preoperative evaluation for left total knee
arthroplasty, history hypertension, smoking, CHF

CHEST - 2 VIEW

[w chest pa]
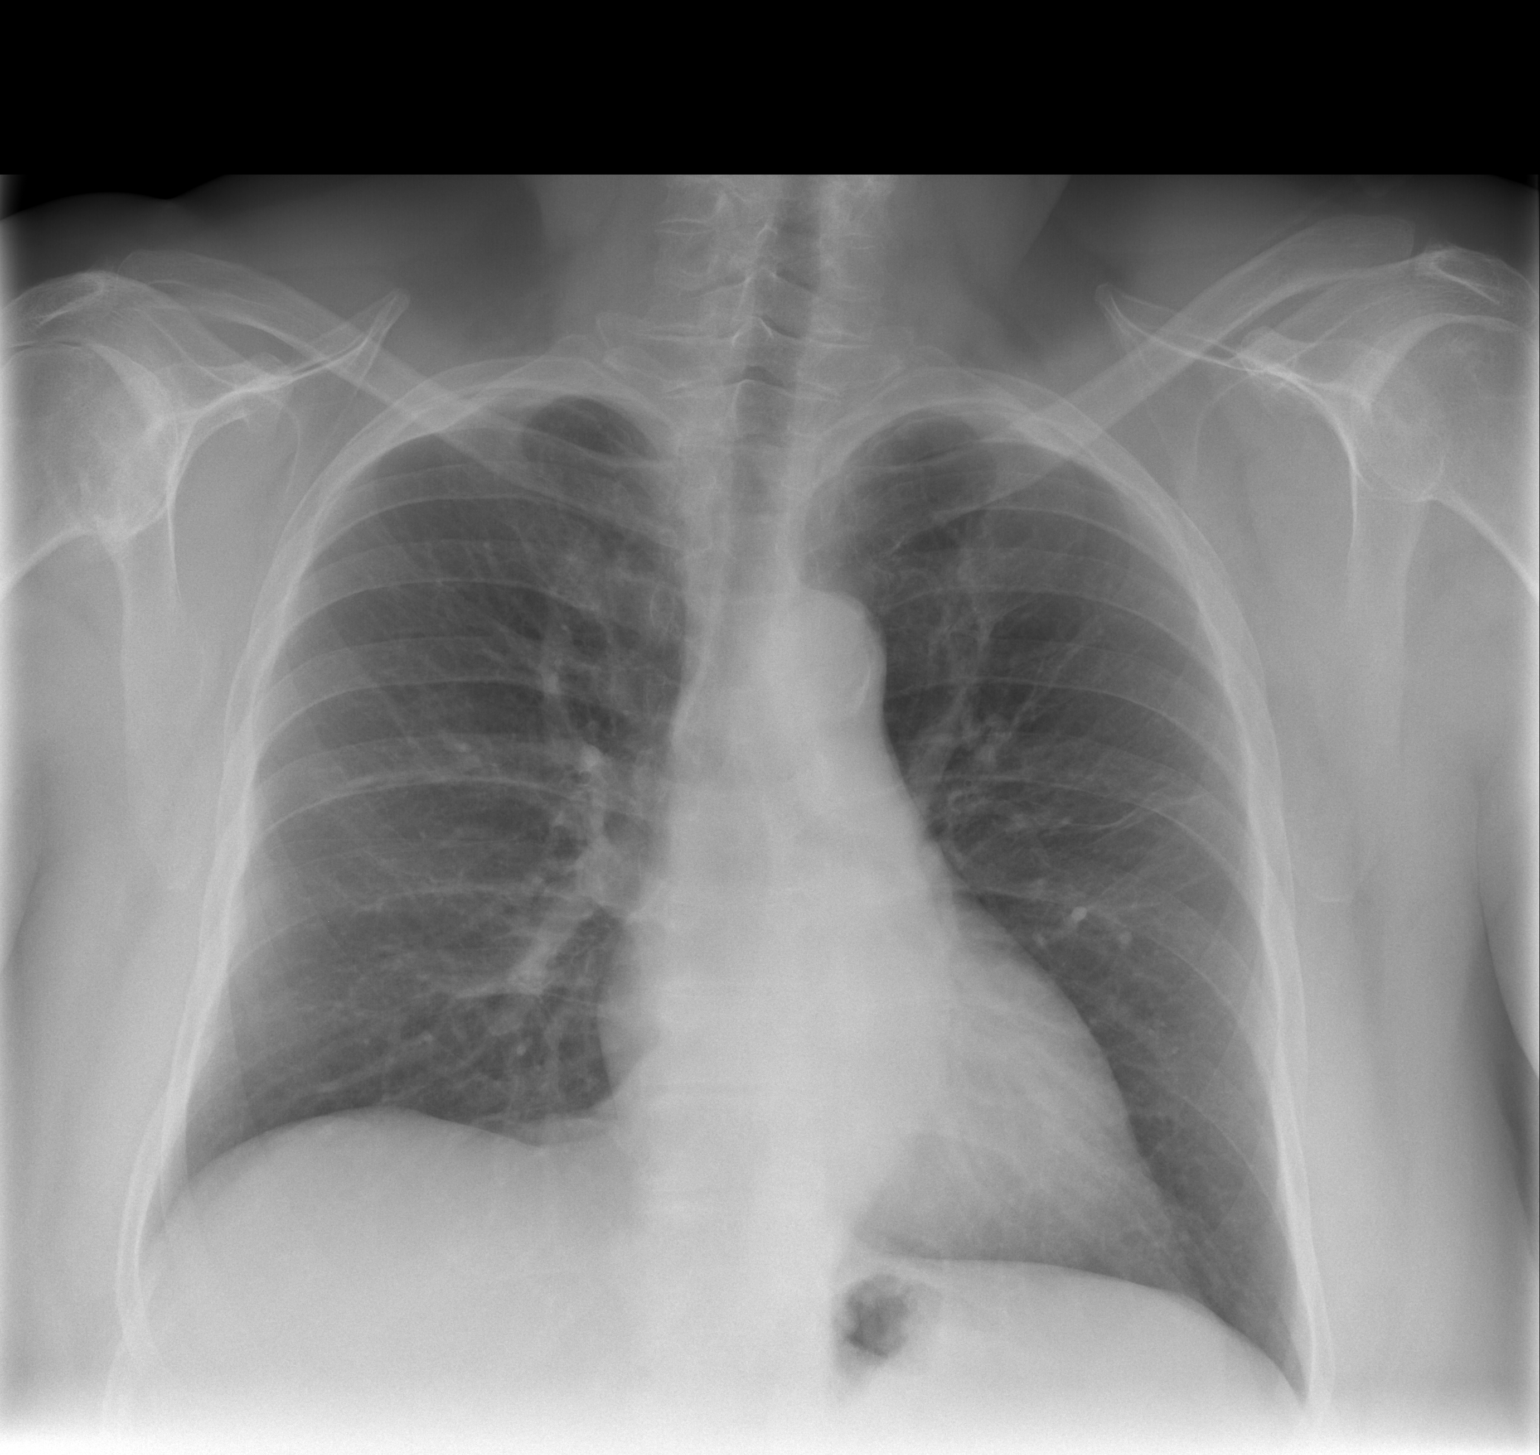

[w chest lat]
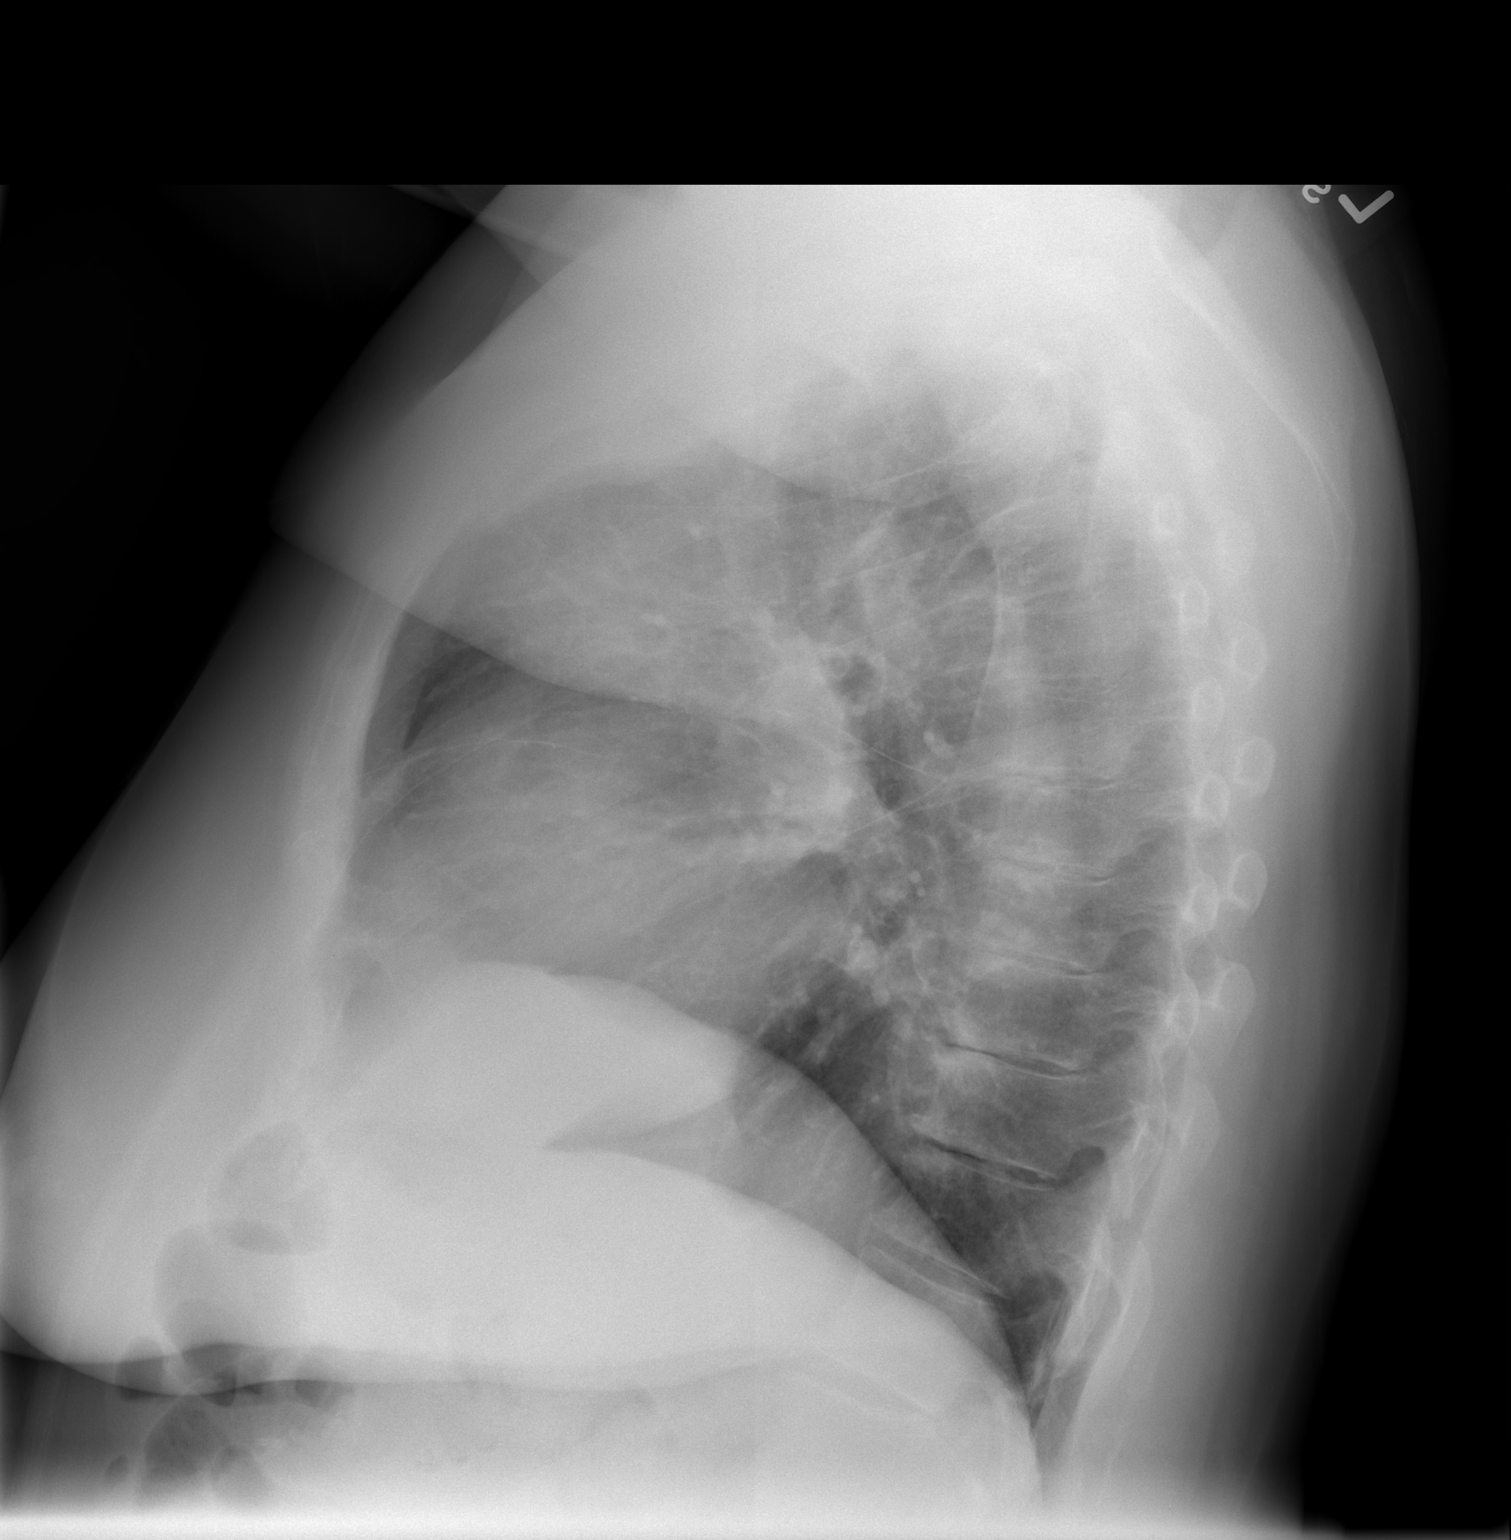

[2 of 2 positions shown; findings below may reference images not displayed]

FINDINGS: Normal heart size and pulmonary vascularity.
Calcified tortuous aorta.
Linear scarring left upper lobe.
Lungs otherwise clear.
No pleural effusion or pneumothorax.
Bones unremarkable.
IMPRESSION: Linear left upper lobe scarring.

## 2015-08-02 DIAGNOSIS — R3129 Other microscopic hematuria: Secondary | ICD-10-CM | POA: Diagnosis not present

## 2015-08-02 DIAGNOSIS — N200 Calculus of kidney: Secondary | ICD-10-CM | POA: Diagnosis not present

## 2015-08-23 DIAGNOSIS — F419 Anxiety disorder, unspecified: Secondary | ICD-10-CM | POA: Diagnosis not present

## 2015-08-23 DIAGNOSIS — M797 Fibromyalgia: Secondary | ICD-10-CM | POA: Diagnosis not present

## 2015-08-23 DIAGNOSIS — R109 Unspecified abdominal pain: Secondary | ICD-10-CM | POA: Diagnosis not present

## 2015-08-23 DIAGNOSIS — I509 Heart failure, unspecified: Secondary | ICD-10-CM | POA: Diagnosis not present

## 2015-08-23 DIAGNOSIS — M545 Low back pain: Secondary | ICD-10-CM | POA: Diagnosis not present

## 2015-08-23 DIAGNOSIS — Z79899 Other long term (current) drug therapy: Secondary | ICD-10-CM | POA: Diagnosis not present

## 2015-08-23 DIAGNOSIS — I10 Essential (primary) hypertension: Secondary | ICD-10-CM | POA: Diagnosis not present

## 2015-08-23 DIAGNOSIS — Z96653 Presence of artificial knee joint, bilateral: Secondary | ICD-10-CM | POA: Diagnosis not present

## 2015-08-23 DIAGNOSIS — G8929 Other chronic pain: Secondary | ICD-10-CM | POA: Diagnosis not present

## 2015-08-23 DIAGNOSIS — Z7951 Long term (current) use of inhaled steroids: Secondary | ICD-10-CM | POA: Diagnosis not present

## 2015-08-23 DIAGNOSIS — R1031 Right lower quadrant pain: Secondary | ICD-10-CM | POA: Diagnosis not present

## 2015-08-26 DIAGNOSIS — M25551 Pain in right hip: Secondary | ICD-10-CM | POA: Diagnosis not present

## 2015-08-26 DIAGNOSIS — M25552 Pain in left hip: Secondary | ICD-10-CM | POA: Diagnosis not present

## 2015-08-26 DIAGNOSIS — G894 Chronic pain syndrome: Secondary | ICD-10-CM | POA: Diagnosis not present

## 2015-08-26 DIAGNOSIS — M5442 Lumbago with sciatica, left side: Secondary | ICD-10-CM | POA: Diagnosis not present

## 2015-08-26 DIAGNOSIS — G890 Central pain syndrome: Secondary | ICD-10-CM | POA: Diagnosis not present

## 2015-09-10 DIAGNOSIS — Z79891 Long term (current) use of opiate analgesic: Secondary | ICD-10-CM | POA: Diagnosis not present

## 2015-09-10 DIAGNOSIS — M545 Low back pain: Secondary | ICD-10-CM | POA: Diagnosis not present

## 2015-09-13 DIAGNOSIS — M545 Low back pain: Secondary | ICD-10-CM | POA: Diagnosis not present

## 2015-09-13 DIAGNOSIS — Z79891 Long term (current) use of opiate analgesic: Secondary | ICD-10-CM | POA: Diagnosis not present

## 2015-09-13 DIAGNOSIS — M25561 Pain in right knee: Secondary | ICD-10-CM | POA: Diagnosis not present

## 2015-09-13 DIAGNOSIS — M79605 Pain in left leg: Secondary | ICD-10-CM | POA: Diagnosis not present

## 2015-09-13 DIAGNOSIS — M25562 Pain in left knee: Secondary | ICD-10-CM | POA: Diagnosis not present

## 2015-09-13 DIAGNOSIS — G894 Chronic pain syndrome: Secondary | ICD-10-CM | POA: Diagnosis not present

## 2015-09-13 DIAGNOSIS — M79604 Pain in right leg: Secondary | ICD-10-CM | POA: Diagnosis not present

## 2015-09-21 DIAGNOSIS — I509 Heart failure, unspecified: Secondary | ICD-10-CM | POA: Diagnosis not present

## 2015-09-21 DIAGNOSIS — E559 Vitamin D deficiency, unspecified: Secondary | ICD-10-CM | POA: Diagnosis not present

## 2015-09-21 DIAGNOSIS — Z87891 Personal history of nicotine dependence: Secondary | ICD-10-CM | POA: Diagnosis not present

## 2015-09-21 DIAGNOSIS — Z6834 Body mass index (BMI) 34.0-34.9, adult: Secondary | ICD-10-CM | POA: Diagnosis not present

## 2015-10-08 DIAGNOSIS — G894 Chronic pain syndrome: Secondary | ICD-10-CM | POA: Diagnosis not present

## 2015-10-19 DIAGNOSIS — I10 Essential (primary) hypertension: Secondary | ICD-10-CM | POA: Diagnosis not present

## 2015-10-19 DIAGNOSIS — G309 Alzheimer's disease, unspecified: Secondary | ICD-10-CM | POA: Diagnosis not present

## 2015-10-19 DIAGNOSIS — Z299 Encounter for prophylactic measures, unspecified: Secondary | ICD-10-CM | POA: Diagnosis not present

## 2015-10-19 DIAGNOSIS — J111 Influenza due to unidentified influenza virus with other respiratory manifestations: Secondary | ICD-10-CM | POA: Diagnosis not present

## 2015-10-26 DIAGNOSIS — B029 Zoster without complications: Secondary | ICD-10-CM | POA: Diagnosis not present

## 2015-10-26 DIAGNOSIS — I10 Essential (primary) hypertension: Secondary | ICD-10-CM | POA: Diagnosis not present

## 2015-10-26 DIAGNOSIS — Z79899 Other long term (current) drug therapy: Secondary | ICD-10-CM | POA: Diagnosis not present

## 2015-10-26 DIAGNOSIS — F329 Major depressive disorder, single episode, unspecified: Secondary | ICD-10-CM | POA: Diagnosis not present

## 2015-10-26 DIAGNOSIS — I509 Heart failure, unspecified: Secondary | ICD-10-CM | POA: Diagnosis not present

## 2015-10-26 DIAGNOSIS — G309 Alzheimer's disease, unspecified: Secondary | ICD-10-CM | POA: Diagnosis not present

## 2015-10-26 DIAGNOSIS — Z299 Encounter for prophylactic measures, unspecified: Secondary | ICD-10-CM | POA: Diagnosis not present

## 2015-10-26 DIAGNOSIS — R0789 Other chest pain: Secondary | ICD-10-CM | POA: Diagnosis not present

## 2015-11-16 DIAGNOSIS — I509 Heart failure, unspecified: Secondary | ICD-10-CM | POA: Diagnosis not present

## 2015-11-16 DIAGNOSIS — R7303 Prediabetes: Secondary | ICD-10-CM | POA: Diagnosis not present

## 2015-11-16 DIAGNOSIS — R531 Weakness: Secondary | ICD-10-CM | POA: Diagnosis not present

## 2015-11-16 DIAGNOSIS — J42 Unspecified chronic bronchitis: Secondary | ICD-10-CM | POA: Diagnosis not present

## 2015-11-16 DIAGNOSIS — N189 Chronic kidney disease, unspecified: Secondary | ICD-10-CM | POA: Diagnosis not present

## 2015-11-16 DIAGNOSIS — M797 Fibromyalgia: Secondary | ICD-10-CM | POA: Diagnosis not present

## 2015-11-16 DIAGNOSIS — E875 Hyperkalemia: Secondary | ICD-10-CM | POA: Diagnosis not present

## 2015-11-16 DIAGNOSIS — J9601 Acute respiratory failure with hypoxia: Secondary | ICD-10-CM | POA: Diagnosis not present

## 2015-11-16 DIAGNOSIS — E874 Mixed disorder of acid-base balance: Secondary | ICD-10-CM | POA: Diagnosis not present

## 2015-11-16 DIAGNOSIS — R0902 Hypoxemia: Secondary | ICD-10-CM | POA: Diagnosis not present

## 2015-11-16 DIAGNOSIS — J441 Chronic obstructive pulmonary disease with (acute) exacerbation: Secondary | ICD-10-CM | POA: Diagnosis not present

## 2015-11-16 DIAGNOSIS — Z79899 Other long term (current) drug therapy: Secondary | ICD-10-CM | POA: Diagnosis not present

## 2015-11-16 DIAGNOSIS — R0689 Other abnormalities of breathing: Secondary | ICD-10-CM | POA: Diagnosis not present

## 2015-11-16 DIAGNOSIS — I10 Essential (primary) hypertension: Secondary | ICD-10-CM | POA: Diagnosis not present

## 2015-11-16 DIAGNOSIS — N19 Unspecified kidney failure: Secondary | ICD-10-CM | POA: Diagnosis not present

## 2015-11-16 DIAGNOSIS — I130 Hypertensive heart and chronic kidney disease with heart failure and stage 1 through stage 4 chronic kidney disease, or unspecified chronic kidney disease: Secondary | ICD-10-CM | POA: Diagnosis not present

## 2015-11-16 DIAGNOSIS — Z7951 Long term (current) use of inhaled steroids: Secondary | ICD-10-CM | POA: Diagnosis not present

## 2015-11-17 DIAGNOSIS — F0280 Dementia in other diseases classified elsewhere without behavioral disturbance: Secondary | ICD-10-CM | POA: Diagnosis present

## 2015-11-17 DIAGNOSIS — G9340 Encephalopathy, unspecified: Secondary | ICD-10-CM | POA: Diagnosis not present

## 2015-11-17 DIAGNOSIS — I110 Hypertensive heart disease with heart failure: Secondary | ICD-10-CM | POA: Diagnosis present

## 2015-11-17 DIAGNOSIS — Z885 Allergy status to narcotic agent status: Secondary | ICD-10-CM | POA: Diagnosis not present

## 2015-11-17 DIAGNOSIS — I130 Hypertensive heart and chronic kidney disease with heart failure and stage 1 through stage 4 chronic kidney disease, or unspecified chronic kidney disease: Secondary | ICD-10-CM | POA: Diagnosis not present

## 2015-11-17 DIAGNOSIS — J9601 Acute respiratory failure with hypoxia: Secondary | ICD-10-CM | POA: Diagnosis not present

## 2015-11-17 DIAGNOSIS — R531 Weakness: Secondary | ICD-10-CM | POA: Diagnosis not present

## 2015-11-17 DIAGNOSIS — I959 Hypotension, unspecified: Secondary | ICD-10-CM | POA: Diagnosis present

## 2015-11-17 DIAGNOSIS — K5730 Diverticulosis of large intestine without perforation or abscess without bleeding: Secondary | ICD-10-CM | POA: Diagnosis not present

## 2015-11-17 DIAGNOSIS — Z87891 Personal history of nicotine dependence: Secondary | ICD-10-CM | POA: Diagnosis not present

## 2015-11-17 DIAGNOSIS — I519 Heart disease, unspecified: Secondary | ICD-10-CM | POA: Diagnosis not present

## 2015-11-17 DIAGNOSIS — R0689 Other abnormalities of breathing: Secondary | ICD-10-CM | POA: Diagnosis not present

## 2015-11-17 DIAGNOSIS — R0902 Hypoxemia: Secondary | ICD-10-CM | POA: Diagnosis not present

## 2015-11-17 DIAGNOSIS — J441 Chronic obstructive pulmonary disease with (acute) exacerbation: Secondary | ICD-10-CM | POA: Diagnosis not present

## 2015-11-17 DIAGNOSIS — R0600 Dyspnea, unspecified: Secondary | ICD-10-CM | POA: Diagnosis not present

## 2015-11-17 DIAGNOSIS — J9621 Acute and chronic respiratory failure with hypoxia: Secondary | ICD-10-CM | POA: Diagnosis present

## 2015-11-17 DIAGNOSIS — J9690 Respiratory failure, unspecified, unspecified whether with hypoxia or hypercapnia: Secondary | ICD-10-CM | POA: Diagnosis not present

## 2015-11-17 DIAGNOSIS — E860 Dehydration: Secondary | ICD-10-CM | POA: Diagnosis present

## 2015-11-17 DIAGNOSIS — N189 Chronic kidney disease, unspecified: Secondary | ICD-10-CM | POA: Diagnosis not present

## 2015-11-17 DIAGNOSIS — N19 Unspecified kidney failure: Secondary | ICD-10-CM | POA: Diagnosis not present

## 2015-11-17 DIAGNOSIS — G309 Alzheimer's disease, unspecified: Secondary | ICD-10-CM | POA: Diagnosis present

## 2015-11-17 DIAGNOSIS — N179 Acute kidney failure, unspecified: Secondary | ICD-10-CM | POA: Diagnosis present

## 2015-11-17 DIAGNOSIS — Z79899 Other long term (current) drug therapy: Secondary | ICD-10-CM | POA: Diagnosis not present

## 2015-11-17 DIAGNOSIS — M797 Fibromyalgia: Secondary | ICD-10-CM | POA: Diagnosis present

## 2015-11-17 DIAGNOSIS — E875 Hyperkalemia: Secondary | ICD-10-CM | POA: Diagnosis present

## 2015-11-17 DIAGNOSIS — I509 Heart failure, unspecified: Secondary | ICD-10-CM | POA: Diagnosis present

## 2015-11-17 DIAGNOSIS — J9622 Acute and chronic respiratory failure with hypercapnia: Secondary | ICD-10-CM | POA: Diagnosis present

## 2015-11-17 DIAGNOSIS — G894 Chronic pain syndrome: Secondary | ICD-10-CM | POA: Diagnosis present

## 2015-11-17 DIAGNOSIS — J9602 Acute respiratory failure with hypercapnia: Secondary | ICD-10-CM | POA: Diagnosis not present

## 2015-11-17 DIAGNOSIS — I082 Rheumatic disorders of both aortic and tricuspid valves: Secondary | ICD-10-CM | POA: Diagnosis not present

## 2015-11-17 DIAGNOSIS — R0602 Shortness of breath: Secondary | ICD-10-CM | POA: Diagnosis not present

## 2015-11-17 DIAGNOSIS — G92 Toxic encephalopathy: Secondary | ICD-10-CM | POA: Diagnosis present

## 2015-12-24 DIAGNOSIS — I509 Heart failure, unspecified: Secondary | ICD-10-CM | POA: Diagnosis not present

## 2015-12-24 DIAGNOSIS — G8929 Other chronic pain: Secondary | ICD-10-CM | POA: Diagnosis not present

## 2015-12-24 DIAGNOSIS — Z299 Encounter for prophylactic measures, unspecified: Secondary | ICD-10-CM | POA: Diagnosis not present

## 2015-12-24 DIAGNOSIS — G309 Alzheimer's disease, unspecified: Secondary | ICD-10-CM | POA: Diagnosis not present

## 2015-12-29 DIAGNOSIS — F329 Major depressive disorder, single episode, unspecified: Secondary | ICD-10-CM | POA: Diagnosis not present

## 2015-12-29 DIAGNOSIS — J449 Chronic obstructive pulmonary disease, unspecified: Secondary | ICD-10-CM | POA: Diagnosis not present

## 2015-12-29 DIAGNOSIS — Z1389 Encounter for screening for other disorder: Secondary | ICD-10-CM | POA: Diagnosis not present

## 2015-12-29 DIAGNOSIS — Z299 Encounter for prophylactic measures, unspecified: Secondary | ICD-10-CM | POA: Diagnosis not present

## 2015-12-29 DIAGNOSIS — N390 Urinary tract infection, site not specified: Secondary | ICD-10-CM | POA: Diagnosis not present

## 2016-01-21 DIAGNOSIS — I10 Essential (primary) hypertension: Secondary | ICD-10-CM | POA: Diagnosis not present

## 2016-01-21 DIAGNOSIS — I509 Heart failure, unspecified: Secondary | ICD-10-CM | POA: Diagnosis not present

## 2016-01-21 DIAGNOSIS — I2510 Atherosclerotic heart disease of native coronary artery without angina pectoris: Secondary | ICD-10-CM | POA: Diagnosis not present

## 2016-01-21 DIAGNOSIS — J449 Chronic obstructive pulmonary disease, unspecified: Secondary | ICD-10-CM | POA: Diagnosis not present

## 2016-01-26 DIAGNOSIS — F329 Major depressive disorder, single episode, unspecified: Secondary | ICD-10-CM | POA: Diagnosis not present

## 2016-01-26 DIAGNOSIS — G309 Alzheimer's disease, unspecified: Secondary | ICD-10-CM | POA: Diagnosis not present

## 2016-01-26 DIAGNOSIS — Z299 Encounter for prophylactic measures, unspecified: Secondary | ICD-10-CM | POA: Diagnosis not present

## 2016-01-26 DIAGNOSIS — I509 Heart failure, unspecified: Secondary | ICD-10-CM | POA: Diagnosis not present

## 2016-01-26 DIAGNOSIS — J449 Chronic obstructive pulmonary disease, unspecified: Secondary | ICD-10-CM | POA: Diagnosis not present

## 2016-02-17 DIAGNOSIS — J449 Chronic obstructive pulmonary disease, unspecified: Secondary | ICD-10-CM | POA: Diagnosis not present

## 2016-02-17 DIAGNOSIS — I509 Heart failure, unspecified: Secondary | ICD-10-CM | POA: Diagnosis not present

## 2016-02-17 DIAGNOSIS — I10 Essential (primary) hypertension: Secondary | ICD-10-CM | POA: Diagnosis not present

## 2016-02-17 DIAGNOSIS — I2510 Atherosclerotic heart disease of native coronary artery without angina pectoris: Secondary | ICD-10-CM | POA: Diagnosis not present

## 2016-03-07 DIAGNOSIS — J449 Chronic obstructive pulmonary disease, unspecified: Secondary | ICD-10-CM | POA: Diagnosis not present

## 2016-03-07 DIAGNOSIS — I509 Heart failure, unspecified: Secondary | ICD-10-CM | POA: Diagnosis not present

## 2016-03-07 DIAGNOSIS — F329 Major depressive disorder, single episode, unspecified: Secondary | ICD-10-CM | POA: Diagnosis not present

## 2016-03-07 DIAGNOSIS — Z299 Encounter for prophylactic measures, unspecified: Secondary | ICD-10-CM | POA: Diagnosis not present

## 2016-03-07 DIAGNOSIS — I270 Primary pulmonary hypertension: Secondary | ICD-10-CM | POA: Diagnosis not present

## 2016-04-11 DIAGNOSIS — Z90710 Acquired absence of both cervix and uterus: Secondary | ICD-10-CM | POA: Diagnosis not present

## 2016-04-11 DIAGNOSIS — G8929 Other chronic pain: Secondary | ICD-10-CM | POA: Diagnosis not present

## 2016-04-11 DIAGNOSIS — Z299 Encounter for prophylactic measures, unspecified: Secondary | ICD-10-CM | POA: Diagnosis not present

## 2016-04-11 DIAGNOSIS — I509 Heart failure, unspecified: Secondary | ICD-10-CM | POA: Diagnosis not present

## 2016-04-25 DIAGNOSIS — Z79891 Long term (current) use of opiate analgesic: Secondary | ICD-10-CM | POA: Diagnosis not present

## 2016-04-25 DIAGNOSIS — M25552 Pain in left hip: Secondary | ICD-10-CM | POA: Diagnosis not present

## 2016-04-25 DIAGNOSIS — M25562 Pain in left knee: Secondary | ICD-10-CM | POA: Diagnosis not present

## 2016-04-25 DIAGNOSIS — G894 Chronic pain syndrome: Secondary | ICD-10-CM | POA: Diagnosis not present

## 2016-04-25 DIAGNOSIS — M25551 Pain in right hip: Secondary | ICD-10-CM | POA: Diagnosis not present

## 2016-04-25 DIAGNOSIS — M5416 Radiculopathy, lumbar region: Secondary | ICD-10-CM | POA: Diagnosis not present

## 2016-04-25 DIAGNOSIS — M25561 Pain in right knee: Secondary | ICD-10-CM | POA: Diagnosis not present

## 2016-04-26 DIAGNOSIS — I739 Peripheral vascular disease, unspecified: Secondary | ICD-10-CM | POA: Diagnosis not present

## 2016-04-26 DIAGNOSIS — Z299 Encounter for prophylactic measures, unspecified: Secondary | ICD-10-CM | POA: Diagnosis not present

## 2016-04-26 DIAGNOSIS — M545 Low back pain: Secondary | ICD-10-CM | POA: Diagnosis not present

## 2016-04-26 DIAGNOSIS — I509 Heart failure, unspecified: Secondary | ICD-10-CM | POA: Diagnosis not present

## 2016-04-26 DIAGNOSIS — I270 Primary pulmonary hypertension: Secondary | ICD-10-CM | POA: Diagnosis not present

## 2016-04-26 DIAGNOSIS — J449 Chronic obstructive pulmonary disease, unspecified: Secondary | ICD-10-CM | POA: Diagnosis not present

## 2016-05-22 DIAGNOSIS — Z79891 Long term (current) use of opiate analgesic: Secondary | ICD-10-CM | POA: Diagnosis not present

## 2016-05-23 DIAGNOSIS — M545 Low back pain: Secondary | ICD-10-CM | POA: Diagnosis not present

## 2016-05-23 DIAGNOSIS — M25562 Pain in left knee: Secondary | ICD-10-CM | POA: Diagnosis not present

## 2016-05-23 DIAGNOSIS — G894 Chronic pain syndrome: Secondary | ICD-10-CM | POA: Diagnosis not present

## 2016-05-23 DIAGNOSIS — M79605 Pain in left leg: Secondary | ICD-10-CM | POA: Diagnosis not present

## 2016-05-23 DIAGNOSIS — M25561 Pain in right knee: Secondary | ICD-10-CM | POA: Diagnosis not present

## 2016-05-23 DIAGNOSIS — M79604 Pain in right leg: Secondary | ICD-10-CM | POA: Diagnosis not present

## 2016-05-26 DIAGNOSIS — I509 Heart failure, unspecified: Secondary | ICD-10-CM | POA: Diagnosis not present

## 2016-05-26 DIAGNOSIS — I2510 Atherosclerotic heart disease of native coronary artery without angina pectoris: Secondary | ICD-10-CM | POA: Diagnosis not present

## 2016-05-26 DIAGNOSIS — J449 Chronic obstructive pulmonary disease, unspecified: Secondary | ICD-10-CM | POA: Diagnosis not present

## 2016-05-26 DIAGNOSIS — I10 Essential (primary) hypertension: Secondary | ICD-10-CM | POA: Diagnosis not present

## 2016-05-29 DIAGNOSIS — G4700 Insomnia, unspecified: Secondary | ICD-10-CM | POA: Diagnosis not present

## 2016-05-29 DIAGNOSIS — Z299 Encounter for prophylactic measures, unspecified: Secondary | ICD-10-CM | POA: Diagnosis not present

## 2016-05-29 DIAGNOSIS — Z79891 Long term (current) use of opiate analgesic: Secondary | ICD-10-CM | POA: Diagnosis not present

## 2016-05-29 DIAGNOSIS — J449 Chronic obstructive pulmonary disease, unspecified: Secondary | ICD-10-CM | POA: Diagnosis not present

## 2016-05-29 DIAGNOSIS — Z6834 Body mass index (BMI) 34.0-34.9, adult: Secondary | ICD-10-CM | POA: Diagnosis not present

## 2016-05-29 DIAGNOSIS — I10 Essential (primary) hypertension: Secondary | ICD-10-CM | POA: Diagnosis not present

## 2016-05-29 DIAGNOSIS — I509 Heart failure, unspecified: Secondary | ICD-10-CM | POA: Diagnosis not present

## 2016-06-02 DIAGNOSIS — Z79891 Long term (current) use of opiate analgesic: Secondary | ICD-10-CM | POA: Diagnosis not present

## 2016-06-13 DIAGNOSIS — J449 Chronic obstructive pulmonary disease, unspecified: Secondary | ICD-10-CM | POA: Diagnosis not present

## 2016-06-13 DIAGNOSIS — I10 Essential (primary) hypertension: Secondary | ICD-10-CM | POA: Diagnosis not present

## 2016-06-13 DIAGNOSIS — I509 Heart failure, unspecified: Secondary | ICD-10-CM | POA: Diagnosis not present

## 2016-06-13 DIAGNOSIS — I2510 Atherosclerotic heart disease of native coronary artery without angina pectoris: Secondary | ICD-10-CM | POA: Diagnosis not present

## 2016-07-03 DIAGNOSIS — Z79891 Long term (current) use of opiate analgesic: Secondary | ICD-10-CM | POA: Diagnosis not present

## 2016-07-13 DIAGNOSIS — I10 Essential (primary) hypertension: Secondary | ICD-10-CM | POA: Diagnosis not present

## 2016-07-13 DIAGNOSIS — I509 Heart failure, unspecified: Secondary | ICD-10-CM | POA: Diagnosis not present

## 2016-07-13 DIAGNOSIS — J449 Chronic obstructive pulmonary disease, unspecified: Secondary | ICD-10-CM | POA: Diagnosis not present

## 2016-07-13 DIAGNOSIS — I2510 Atherosclerotic heart disease of native coronary artery without angina pectoris: Secondary | ICD-10-CM | POA: Diagnosis not present

## 2016-08-03 DIAGNOSIS — Z79891 Long term (current) use of opiate analgesic: Secondary | ICD-10-CM | POA: Diagnosis not present

## 2016-08-11 DIAGNOSIS — J449 Chronic obstructive pulmonary disease, unspecified: Secondary | ICD-10-CM | POA: Diagnosis not present

## 2016-08-11 DIAGNOSIS — I10 Essential (primary) hypertension: Secondary | ICD-10-CM | POA: Diagnosis not present

## 2016-08-11 DIAGNOSIS — I2510 Atherosclerotic heart disease of native coronary artery without angina pectoris: Secondary | ICD-10-CM | POA: Diagnosis not present

## 2016-08-11 DIAGNOSIS — I509 Heart failure, unspecified: Secondary | ICD-10-CM | POA: Diagnosis not present

## 2016-09-01 DIAGNOSIS — Z79891 Long term (current) use of opiate analgesic: Secondary | ICD-10-CM | POA: Diagnosis not present

## 2016-09-05 ENCOUNTER — Other Ambulatory Visit: Payer: Self-pay | Admitting: Pain Medicine

## 2016-09-08 DIAGNOSIS — I2510 Atherosclerotic heart disease of native coronary artery without angina pectoris: Secondary | ICD-10-CM | POA: Diagnosis not present

## 2016-09-08 DIAGNOSIS — I10 Essential (primary) hypertension: Secondary | ICD-10-CM | POA: Diagnosis not present

## 2016-09-08 DIAGNOSIS — I509 Heart failure, unspecified: Secondary | ICD-10-CM | POA: Diagnosis not present

## 2016-09-08 DIAGNOSIS — J449 Chronic obstructive pulmonary disease, unspecified: Secondary | ICD-10-CM | POA: Diagnosis not present

## 2016-10-03 DIAGNOSIS — M79604 Pain in right leg: Secondary | ICD-10-CM | POA: Diagnosis not present

## 2016-10-03 DIAGNOSIS — M545 Low back pain: Secondary | ICD-10-CM | POA: Diagnosis not present

## 2016-10-03 DIAGNOSIS — M542 Cervicalgia: Secondary | ICD-10-CM | POA: Diagnosis not present

## 2016-10-03 DIAGNOSIS — M25562 Pain in left knee: Secondary | ICD-10-CM | POA: Diagnosis not present

## 2016-10-03 DIAGNOSIS — M79605 Pain in left leg: Secondary | ICD-10-CM | POA: Diagnosis not present

## 2016-10-03 DIAGNOSIS — M25561 Pain in right knee: Secondary | ICD-10-CM | POA: Diagnosis not present

## 2016-10-03 DIAGNOSIS — Z79891 Long term (current) use of opiate analgesic: Secondary | ICD-10-CM | POA: Diagnosis not present

## 2016-10-03 DIAGNOSIS — G894 Chronic pain syndrome: Secondary | ICD-10-CM | POA: Diagnosis not present

## 2016-10-04 DIAGNOSIS — Z79891 Long term (current) use of opiate analgesic: Secondary | ICD-10-CM | POA: Diagnosis not present

## 2016-10-12 DIAGNOSIS — I2510 Atherosclerotic heart disease of native coronary artery without angina pectoris: Secondary | ICD-10-CM | POA: Diagnosis not present

## 2016-10-12 DIAGNOSIS — I509 Heart failure, unspecified: Secondary | ICD-10-CM | POA: Diagnosis not present

## 2016-10-12 DIAGNOSIS — J449 Chronic obstructive pulmonary disease, unspecified: Secondary | ICD-10-CM | POA: Diagnosis not present

## 2016-10-12 DIAGNOSIS — I10 Essential (primary) hypertension: Secondary | ICD-10-CM | POA: Diagnosis not present

## 2016-11-13 DIAGNOSIS — J449 Chronic obstructive pulmonary disease, unspecified: Secondary | ICD-10-CM | POA: Diagnosis not present

## 2016-11-13 DIAGNOSIS — I10 Essential (primary) hypertension: Secondary | ICD-10-CM | POA: Diagnosis not present

## 2016-11-13 DIAGNOSIS — I509 Heart failure, unspecified: Secondary | ICD-10-CM | POA: Diagnosis not present

## 2016-11-13 DIAGNOSIS — I2510 Atherosclerotic heart disease of native coronary artery without angina pectoris: Secondary | ICD-10-CM | POA: Diagnosis not present

## 2016-11-27 DIAGNOSIS — J309 Allergic rhinitis, unspecified: Secondary | ICD-10-CM | POA: Diagnosis not present

## 2016-11-27 DIAGNOSIS — H6121 Impacted cerumen, right ear: Secondary | ICD-10-CM | POA: Diagnosis not present

## 2016-11-27 DIAGNOSIS — H9201 Otalgia, right ear: Secondary | ICD-10-CM | POA: Diagnosis not present

## 2016-12-13 DIAGNOSIS — I2510 Atherosclerotic heart disease of native coronary artery without angina pectoris: Secondary | ICD-10-CM | POA: Diagnosis not present

## 2016-12-13 DIAGNOSIS — J449 Chronic obstructive pulmonary disease, unspecified: Secondary | ICD-10-CM | POA: Diagnosis not present

## 2016-12-13 DIAGNOSIS — I10 Essential (primary) hypertension: Secondary | ICD-10-CM | POA: Diagnosis not present

## 2016-12-13 DIAGNOSIS — I509 Heart failure, unspecified: Secondary | ICD-10-CM | POA: Diagnosis not present

## 2017-02-26 DIAGNOSIS — I10 Essential (primary) hypertension: Secondary | ICD-10-CM | POA: Diagnosis not present

## 2017-02-26 DIAGNOSIS — I509 Heart failure, unspecified: Secondary | ICD-10-CM | POA: Diagnosis not present

## 2017-02-26 DIAGNOSIS — J449 Chronic obstructive pulmonary disease, unspecified: Secondary | ICD-10-CM | POA: Diagnosis not present

## 2017-02-26 DIAGNOSIS — I2510 Atherosclerotic heart disease of native coronary artery without angina pectoris: Secondary | ICD-10-CM | POA: Diagnosis not present

## 2017-03-23 DIAGNOSIS — I10 Essential (primary) hypertension: Secondary | ICD-10-CM | POA: Diagnosis not present

## 2017-03-23 DIAGNOSIS — J449 Chronic obstructive pulmonary disease, unspecified: Secondary | ICD-10-CM | POA: Diagnosis not present

## 2017-03-23 DIAGNOSIS — I2510 Atherosclerotic heart disease of native coronary artery without angina pectoris: Secondary | ICD-10-CM | POA: Diagnosis not present

## 2017-03-23 DIAGNOSIS — I509 Heart failure, unspecified: Secondary | ICD-10-CM | POA: Diagnosis not present

## 2017-03-30 DIAGNOSIS — Z299 Encounter for prophylactic measures, unspecified: Secondary | ICD-10-CM | POA: Diagnosis not present

## 2017-03-30 DIAGNOSIS — I739 Peripheral vascular disease, unspecified: Secondary | ICD-10-CM | POA: Diagnosis not present

## 2017-03-30 DIAGNOSIS — M19019 Primary osteoarthritis, unspecified shoulder: Secondary | ICD-10-CM | POA: Diagnosis not present

## 2017-03-30 DIAGNOSIS — G309 Alzheimer's disease, unspecified: Secondary | ICD-10-CM | POA: Diagnosis not present

## 2017-03-30 DIAGNOSIS — F0280 Dementia in other diseases classified elsewhere without behavioral disturbance: Secondary | ICD-10-CM | POA: Diagnosis not present

## 2017-03-30 DIAGNOSIS — I10 Essential (primary) hypertension: Secondary | ICD-10-CM | POA: Diagnosis not present

## 2017-03-30 DIAGNOSIS — G8929 Other chronic pain: Secondary | ICD-10-CM | POA: Diagnosis not present

## 2017-03-30 DIAGNOSIS — Z6838 Body mass index (BMI) 38.0-38.9, adult: Secondary | ICD-10-CM | POA: Diagnosis not present

## 2017-03-30 DIAGNOSIS — I509 Heart failure, unspecified: Secondary | ICD-10-CM | POA: Diagnosis not present

## 2017-03-30 DIAGNOSIS — J449 Chronic obstructive pulmonary disease, unspecified: Secondary | ICD-10-CM | POA: Diagnosis not present

## 2017-03-30 DIAGNOSIS — E669 Obesity, unspecified: Secondary | ICD-10-CM | POA: Diagnosis not present

## 2017-04-06 DIAGNOSIS — J449 Chronic obstructive pulmonary disease, unspecified: Secondary | ICD-10-CM | POA: Diagnosis not present

## 2017-04-06 DIAGNOSIS — I2510 Atherosclerotic heart disease of native coronary artery without angina pectoris: Secondary | ICD-10-CM | POA: Diagnosis not present

## 2017-04-06 DIAGNOSIS — I509 Heart failure, unspecified: Secondary | ICD-10-CM | POA: Diagnosis not present

## 2017-04-06 DIAGNOSIS — I10 Essential (primary) hypertension: Secondary | ICD-10-CM | POA: Diagnosis not present

## 2017-05-15 DIAGNOSIS — I10 Essential (primary) hypertension: Secondary | ICD-10-CM | POA: Diagnosis not present

## 2017-05-15 DIAGNOSIS — J449 Chronic obstructive pulmonary disease, unspecified: Secondary | ICD-10-CM | POA: Diagnosis not present

## 2017-05-15 DIAGNOSIS — I2510 Atherosclerotic heart disease of native coronary artery without angina pectoris: Secondary | ICD-10-CM | POA: Diagnosis not present

## 2017-05-15 DIAGNOSIS — I509 Heart failure, unspecified: Secondary | ICD-10-CM | POA: Diagnosis not present

## 2017-06-06 DIAGNOSIS — I509 Heart failure, unspecified: Secondary | ICD-10-CM | POA: Diagnosis not present

## 2017-06-06 DIAGNOSIS — J449 Chronic obstructive pulmonary disease, unspecified: Secondary | ICD-10-CM | POA: Diagnosis not present

## 2017-06-06 DIAGNOSIS — I2510 Atherosclerotic heart disease of native coronary artery without angina pectoris: Secondary | ICD-10-CM | POA: Diagnosis not present

## 2017-06-06 DIAGNOSIS — I10 Essential (primary) hypertension: Secondary | ICD-10-CM | POA: Diagnosis not present

## 2017-06-18 DIAGNOSIS — Z299 Encounter for prophylactic measures, unspecified: Secondary | ICD-10-CM | POA: Diagnosis not present

## 2017-06-18 DIAGNOSIS — Z6838 Body mass index (BMI) 38.0-38.9, adult: Secondary | ICD-10-CM | POA: Diagnosis not present

## 2017-06-18 DIAGNOSIS — I509 Heart failure, unspecified: Secondary | ICD-10-CM | POA: Diagnosis not present

## 2017-06-18 DIAGNOSIS — J449 Chronic obstructive pulmonary disease, unspecified: Secondary | ICD-10-CM | POA: Diagnosis not present

## 2017-06-18 DIAGNOSIS — I739 Peripheral vascular disease, unspecified: Secondary | ICD-10-CM | POA: Diagnosis not present

## 2017-06-18 DIAGNOSIS — I270 Primary pulmonary hypertension: Secondary | ICD-10-CM | POA: Diagnosis not present

## 2017-06-18 DIAGNOSIS — F329 Major depressive disorder, single episode, unspecified: Secondary | ICD-10-CM | POA: Diagnosis not present

## 2017-09-04 DIAGNOSIS — M25561 Pain in right knee: Secondary | ICD-10-CM | POA: Diagnosis not present

## 2017-09-04 DIAGNOSIS — M545 Low back pain: Secondary | ICD-10-CM | POA: Diagnosis not present

## 2017-09-04 DIAGNOSIS — G890 Central pain syndrome: Secondary | ICD-10-CM | POA: Diagnosis not present

## 2017-09-04 DIAGNOSIS — M25562 Pain in left knee: Secondary | ICD-10-CM | POA: Diagnosis not present

## 2017-09-04 DIAGNOSIS — M546 Pain in thoracic spine: Secondary | ICD-10-CM | POA: Diagnosis not present

## 2017-10-01 DIAGNOSIS — Z6837 Body mass index (BMI) 37.0-37.9, adult: Secondary | ICD-10-CM | POA: Diagnosis not present

## 2017-10-01 DIAGNOSIS — F329 Major depressive disorder, single episode, unspecified: Secondary | ICD-10-CM | POA: Diagnosis not present

## 2017-10-01 DIAGNOSIS — I739 Peripheral vascular disease, unspecified: Secondary | ICD-10-CM | POA: Diagnosis not present

## 2017-10-01 DIAGNOSIS — I509 Heart failure, unspecified: Secondary | ICD-10-CM | POA: Diagnosis not present

## 2017-10-01 DIAGNOSIS — I270 Primary pulmonary hypertension: Secondary | ICD-10-CM | POA: Diagnosis not present

## 2017-10-01 DIAGNOSIS — M25511 Pain in right shoulder: Secondary | ICD-10-CM | POA: Diagnosis not present

## 2017-10-01 DIAGNOSIS — I10 Essential (primary) hypertension: Secondary | ICD-10-CM | POA: Diagnosis not present

## 2017-11-24 NOTE — Progress Notes (Deleted)
Psychiatric Initial Adult Assessment   Patient Identification: Jessica Lopez MRN:  [id number] Date of Evaluation:  11/24/2017 Referral Source: Dr. Kirstie Peri Chief Complaint:   Visit Diagnosis: No diagnosis found.  History of Present Illness:   Jessica Lopez is a 77 y.o. year old female with a history of osteoarthritis, hypertension, who is referred for    Associated Signs/Symptoms: Depression Symptoms:  {DEPRESSION SYMPTOMS:20000} (Hypo) Manic Symptoms:  {BHH MANIC SYMPTOMS:22872} Anxiety Symptoms:  {BHH ANXIETY SYMPTOMS:22873} Psychotic Symptoms:  {BHH PSYCHOTIC SYMPTOMS:22874} PTSD Symptoms: {BHH PTSD SYMPTOMS:22875}  Past Psychiatric History:  Outpatient:  Psychiatry admission:  Previous suicide attempt:  Past trials of medication:  History of violence:   Previous Psychotropic Medications: {YES/NO:21197}  Substance Abuse History in the last 12 months:  {yes no:314532}  Consequences of Substance Abuse: {BHH CONSEQUENCES OF SUBSTANCE ABUSE:22880}  Past Medical History:  Past Medical History:  Diagnosis Date  . Anemia   . Anxiety   . CHF (congestive heart failure)   . Chronic constipation   . GERD (gastroesophageal reflux disease)   . Headache(784.0)    migraines  . Hypertension   . Lower back pain   . Osteoarthritis     Past Surgical History:  Procedure Laterality Date  . ABDOMINAL HYSTERECTOMY    . COLONOSCOPY  11/30/2011   QBV:QXIH papilla/internal hemorrhoids.  Rectum and colon appeared otherwise normal  . KNEE SURGERY     right  . TOTAL KNEE ARTHROPLASTY Left 04/10/2013   Procedure: LEFT TOTAL KNEE ARTHROPLASTY;  Surgeon: Javier Docker, MD;  Location: [facility] ORS;  Service: Orthopedics;  Laterality: Left;    Family Psychiatric History: ***  Family History:  Family History  Problem Relation Age of Onset  . Colon cancer Neg Hx     Social History:   Social History   Socioeconomic History  . Marital status: Married    Spouse name: Not on file   . Number of children: Not on file  . Years of education: Not on file  . Highest education level: Not on file  Occupational History  . Not on file  Social Needs  . Financial resource strain: Not on file  . Food insecurity:    Worry: Not on file    Inability: Not on file  . Transportation needs:    Medical: Not on file    Non-medical: Not on file  Tobacco Use  . Smoking status: Former Smoker    Packs/day: 0.50    Types: Cigarettes  . Smokeless tobacco: Former Neurosurgeon    Quit date: 10/09/1964  . Tobacco comment: quit about 35+ years ago  Substance and Sexual Activity  . Alcohol use: Yes    Comment: very little(wine)  . Drug use: No  . Sexual activity: Not on file  Lifestyle  . Physical activity:    Days per week: Not on file    Minutes per session: Not on file  . Stress: Not on file  Relationships  . Social connections:    Talks on phone: Not on file    Gets together: Not on file    Attends religious service: Not on file    Active member of club or organization: Not on file    Attends meetings of clubs or organizations: Not on file    Relationship status: Not on file  Other Topics Concern  . Not on file  Social History Narrative  . Not on file    Additional Social History: ***  Allergies:   Allergies  Allergen Reactions  . Hydrocodone Itching    Metabolic Disorder Labs: No results found for: HGBA1C, MPG No results found for: PROLACTIN No results found for: CHOL, TRIG, HDL, CHOLHDL, VLDL, LDLCALC   Current Medications: Current Outpatient Medications  Medication Sig Dispense Refill  . ALPRAZolam (XANAX) 0.25 MG tablet Take 1 tablet (0.25 mg total) by mouth 4 (four) times daily as needed for anxiety. 20 tablet 0  . amLODipine-benazepril (LOTREL) 5-20 MG per capsule Take 1 capsule by mouth every morning.     . bifidobacterium infantis (ALIGN) capsule Take 1 capsule by mouth daily.    . clonazePAM (KLONOPIN) 0.5 MG tablet Take one tablet by mouth at bedtime as  needed for anxiety/sleep; Take two tablets by mouth at bedtime as needed for anxiety/sleep 20 tablet 0  . DULoxetine (CYMBALTA) 60 MG capsule Take 60 mg by mouth every morning.     . fexofenadine (ALLEGRA) 180 MG tablet Take 180 mg by mouth daily.    . fish oil-omega-3 fatty acids 1000 MG capsule Take 2 g by mouth daily.    . folic acid (FOLVITE) 1 MG tablet Take 1 mg by mouth daily.    . furosemide (LASIX) 40 MG tablet Take 1 tablet (40 mg total) by mouth 2 (two) times daily. 60 tablet 0  . KLOR-CON M20 20 MEQ tablet Take 20 mEq by mouth daily.     . Linaclotide (LINZESS) 290 MCG CAPS Take 290 mcg by mouth daily. 30 capsule 11  . metoprolol tartrate (LOPRESSOR) 25 MG tablet Take 1 tablet (25 mg total) by mouth 2 (two) times daily. 60 tablet 0  . NEXIUM 40 MG capsule Take 40 mg by mouth daily before breakfast.     . oxyCODONE 10 MG TABS Take 1-1.5 tablets (10-15 mg total) by mouth every 4 (four) hours as needed. 30 tablet 0  . rivaroxaban (XARELTO) 10 MG TABS tablet Take 1 tablet (10 mg total) by mouth daily. Can stop once abulatory 14 tablet 0  . tiZANidine (ZANAFLEX) 4 MG tablet Take 4 mg by mouth 3 (three) times daily.     . vitamin B-12 (CYANOCOBALAMIN) 100 MCG tablet Take 100 mcg by mouth daily.     No current facility-administered medications for this visit.     Neurologic: Headache: No Seizure: No Paresthesias:No  Musculoskeletal: Strength & Muscle Tone: within normal limits Gait & Station: normal Patient leans: N/A  Psychiatric Specialty Exam: ROS  There were no vitals taken for this visit.There is no height or weight on file to calculate BMI.  General Appearance: Fairly Groomed  Eye Contact:  Good  Speech:  Clear and Coherent  Volume:  Normal  Mood:  {BHH MOOD:22306}  Affect:  {Affect (PAA):22687}  Thought Process:  Coherent  Orientation:  Full (Time, Place, and Person)  Thought Content:  Logical  Suicidal Thoughts:  {ST/HT (PAA):22692}  Homicidal Thoughts:  {ST/HT  (PAA):22692}  Memory:  Immediate;   Good  Judgement:  {Judgement (PAA):22694}  Insight:  {Insight (PAA):22695}  Psychomotor Activity:  Normal  Concentration:  Concentration: Good and Attention Span: Good  Recall:  Good  Fund of Knowledge:Good  Language: Good  Akathisia:  No  Handed:  Right  AIMS (if indicated):  N/A  Assets:  Communication Skills Desire for Improvement  ADL's:  Intact  Cognition: WNL  Sleep:  ***   Assessment  Plan  The patient demonstrates the following risk factors for suicide: Chronic risk factors for suicide include: {Chronic Risk Factors for JOACZYS:06301601}. Acute risk  factors for suicide include: {Acute Risk Factors for URKYHCW:23762831}. Protective factors for this patient include: {Protective Factors for Suicide DVVO:16073710}. Considering these factors, the overall suicide risk at this point appears to be {Desc; low/moderate/high:110033}. Patient {ACTION; IS/IS GYI:94854627} appropriate for outpatient follow up.   Treatment Plan Summary: Plan as above   Neysa Hotter, MD 4/27/20198:51 AM

## 2017-11-26 ENCOUNTER — Ambulatory Visit (HOSPITAL_COMMUNITY): Payer: Self-pay | Admitting: Psychiatry

## 2017-12-19 DIAGNOSIS — Z6839 Body mass index (BMI) 39.0-39.9, adult: Secondary | ICD-10-CM | POA: Diagnosis not present

## 2017-12-19 DIAGNOSIS — Z299 Encounter for prophylactic measures, unspecified: Secondary | ICD-10-CM | POA: Diagnosis not present

## 2017-12-19 DIAGNOSIS — J449 Chronic obstructive pulmonary disease, unspecified: Secondary | ICD-10-CM | POA: Diagnosis not present

## 2017-12-19 DIAGNOSIS — G309 Alzheimer's disease, unspecified: Secondary | ICD-10-CM | POA: Diagnosis not present

## 2017-12-19 DIAGNOSIS — I509 Heart failure, unspecified: Secondary | ICD-10-CM | POA: Diagnosis not present

## 2017-12-19 DIAGNOSIS — I10 Essential (primary) hypertension: Secondary | ICD-10-CM | POA: Diagnosis not present

## 2017-12-19 DIAGNOSIS — M25519 Pain in unspecified shoulder: Secondary | ICD-10-CM | POA: Diagnosis not present

## 2017-12-19 DIAGNOSIS — F0280 Dementia in other diseases classified elsewhere without behavioral disturbance: Secondary | ICD-10-CM | POA: Diagnosis not present

## 2018-01-03 DIAGNOSIS — J449 Chronic obstructive pulmonary disease, unspecified: Secondary | ICD-10-CM | POA: Diagnosis not present

## 2018-01-03 DIAGNOSIS — I2510 Atherosclerotic heart disease of native coronary artery without angina pectoris: Secondary | ICD-10-CM | POA: Diagnosis not present

## 2018-01-03 DIAGNOSIS — I10 Essential (primary) hypertension: Secondary | ICD-10-CM | POA: Diagnosis not present

## 2018-01-03 DIAGNOSIS — I509 Heart failure, unspecified: Secondary | ICD-10-CM | POA: Diagnosis not present

## 2018-03-08 DIAGNOSIS — Z299 Encounter for prophylactic measures, unspecified: Secondary | ICD-10-CM | POA: Diagnosis not present

## 2018-03-08 DIAGNOSIS — I270 Primary pulmonary hypertension: Secondary | ICD-10-CM | POA: Diagnosis not present

## 2018-03-08 DIAGNOSIS — G8929 Other chronic pain: Secondary | ICD-10-CM | POA: Diagnosis not present

## 2018-03-08 DIAGNOSIS — F0280 Dementia in other diseases classified elsewhere without behavioral disturbance: Secondary | ICD-10-CM | POA: Diagnosis not present

## 2018-03-08 DIAGNOSIS — Z6837 Body mass index (BMI) 37.0-37.9, adult: Secondary | ICD-10-CM | POA: Diagnosis not present

## 2018-03-08 DIAGNOSIS — J449 Chronic obstructive pulmonary disease, unspecified: Secondary | ICD-10-CM | POA: Diagnosis not present

## 2018-03-08 DIAGNOSIS — G309 Alzheimer's disease, unspecified: Secondary | ICD-10-CM | POA: Diagnosis not present

## 2018-06-07 DIAGNOSIS — G8929 Other chronic pain: Secondary | ICD-10-CM | POA: Diagnosis not present

## 2018-06-07 DIAGNOSIS — M25551 Pain in right hip: Secondary | ICD-10-CM | POA: Diagnosis not present

## 2018-06-07 DIAGNOSIS — Z6836 Body mass index (BMI) 36.0-36.9, adult: Secondary | ICD-10-CM | POA: Diagnosis not present

## 2018-06-07 DIAGNOSIS — M461 Sacroiliitis, not elsewhere classified: Secondary | ICD-10-CM | POA: Diagnosis not present

## 2018-06-07 DIAGNOSIS — M170 Bilateral primary osteoarthritis of knee: Secondary | ICD-10-CM | POA: Diagnosis not present

## 2018-06-07 DIAGNOSIS — I10 Essential (primary) hypertension: Secondary | ICD-10-CM | POA: Diagnosis not present

## 2018-06-07 DIAGNOSIS — G4700 Insomnia, unspecified: Secondary | ICD-10-CM | POA: Diagnosis not present

## 2018-06-07 DIAGNOSIS — Z299 Encounter for prophylactic measures, unspecified: Secondary | ICD-10-CM | POA: Diagnosis not present

## 2018-08-13 DIAGNOSIS — H52203 Unspecified astigmatism, bilateral: Secondary | ICD-10-CM | POA: Diagnosis not present

## 2018-08-13 DIAGNOSIS — H2513 Age-related nuclear cataract, bilateral: Secondary | ICD-10-CM | POA: Diagnosis not present

## 2018-09-09 DIAGNOSIS — Z6837 Body mass index (BMI) 37.0-37.9, adult: Secondary | ICD-10-CM | POA: Diagnosis not present

## 2018-09-09 DIAGNOSIS — G4700 Insomnia, unspecified: Secondary | ICD-10-CM | POA: Diagnosis not present

## 2018-09-09 DIAGNOSIS — Z299 Encounter for prophylactic measures, unspecified: Secondary | ICD-10-CM | POA: Diagnosis not present

## 2018-09-09 DIAGNOSIS — Z789 Other specified health status: Secondary | ICD-10-CM | POA: Diagnosis not present

## 2018-09-09 DIAGNOSIS — J449 Chronic obstructive pulmonary disease, unspecified: Secondary | ICD-10-CM | POA: Diagnosis not present

## 2018-09-09 DIAGNOSIS — F419 Anxiety disorder, unspecified: Secondary | ICD-10-CM | POA: Diagnosis not present

## 2018-09-09 DIAGNOSIS — I270 Primary pulmonary hypertension: Secondary | ICD-10-CM | POA: Diagnosis not present

## 2018-10-17 DIAGNOSIS — I509 Heart failure, unspecified: Secondary | ICD-10-CM | POA: Diagnosis not present

## 2018-10-17 DIAGNOSIS — I10 Essential (primary) hypertension: Secondary | ICD-10-CM | POA: Diagnosis not present

## 2018-10-17 DIAGNOSIS — J449 Chronic obstructive pulmonary disease, unspecified: Secondary | ICD-10-CM | POA: Diagnosis not present

## 2018-10-17 DIAGNOSIS — I2510 Atherosclerotic heart disease of native coronary artery without angina pectoris: Secondary | ICD-10-CM | POA: Diagnosis not present

## 2018-11-06 DIAGNOSIS — G8929 Other chronic pain: Secondary | ICD-10-CM | POA: Diagnosis not present

## 2018-11-06 DIAGNOSIS — I509 Heart failure, unspecified: Secondary | ICD-10-CM | POA: Diagnosis not present

## 2018-11-06 DIAGNOSIS — Z299 Encounter for prophylactic measures, unspecified: Secondary | ICD-10-CM | POA: Diagnosis not present

## 2018-11-06 DIAGNOSIS — G309 Alzheimer's disease, unspecified: Secondary | ICD-10-CM | POA: Diagnosis not present

## 2018-11-06 DIAGNOSIS — Z6837 Body mass index (BMI) 37.0-37.9, adult: Secondary | ICD-10-CM | POA: Diagnosis not present

## 2018-11-06 DIAGNOSIS — I10 Essential (primary) hypertension: Secondary | ICD-10-CM | POA: Diagnosis not present

## 2018-12-10 DIAGNOSIS — I739 Peripheral vascular disease, unspecified: Secondary | ICD-10-CM | POA: Diagnosis not present

## 2018-12-10 DIAGNOSIS — F419 Anxiety disorder, unspecified: Secondary | ICD-10-CM | POA: Diagnosis not present

## 2018-12-10 DIAGNOSIS — Z6837 Body mass index (BMI) 37.0-37.9, adult: Secondary | ICD-10-CM | POA: Diagnosis not present

## 2018-12-10 DIAGNOSIS — G4700 Insomnia, unspecified: Secondary | ICD-10-CM | POA: Diagnosis not present

## 2018-12-10 DIAGNOSIS — J449 Chronic obstructive pulmonary disease, unspecified: Secondary | ICD-10-CM | POA: Diagnosis not present

## 2018-12-10 DIAGNOSIS — Z299 Encounter for prophylactic measures, unspecified: Secondary | ICD-10-CM | POA: Diagnosis not present

## 2019-01-06 DIAGNOSIS — M25561 Pain in right knee: Secondary | ICD-10-CM | POA: Diagnosis not present

## 2019-01-06 DIAGNOSIS — G894 Chronic pain syndrome: Secondary | ICD-10-CM | POA: Diagnosis not present

## 2019-01-06 DIAGNOSIS — Z79891 Long term (current) use of opiate analgesic: Secondary | ICD-10-CM | POA: Diagnosis not present

## 2019-01-06 DIAGNOSIS — M545 Low back pain: Secondary | ICD-10-CM | POA: Diagnosis not present

## 2019-01-06 DIAGNOSIS — M25511 Pain in right shoulder: Secondary | ICD-10-CM | POA: Diagnosis not present

## 2019-01-06 DIAGNOSIS — M25562 Pain in left knee: Secondary | ICD-10-CM | POA: Diagnosis not present

## 2019-01-06 DIAGNOSIS — K5909 Other constipation: Secondary | ICD-10-CM | POA: Diagnosis not present

## 2019-01-06 DIAGNOSIS — R252 Cramp and spasm: Secondary | ICD-10-CM | POA: Diagnosis not present

## 2019-03-07 DIAGNOSIS — Z6837 Body mass index (BMI) 37.0-37.9, adult: Secondary | ICD-10-CM | POA: Diagnosis not present

## 2019-03-07 DIAGNOSIS — G8929 Other chronic pain: Secondary | ICD-10-CM | POA: Diagnosis not present

## 2019-03-07 DIAGNOSIS — G309 Alzheimer's disease, unspecified: Secondary | ICD-10-CM | POA: Diagnosis not present

## 2019-03-07 DIAGNOSIS — I509 Heart failure, unspecified: Secondary | ICD-10-CM | POA: Diagnosis not present

## 2019-03-07 DIAGNOSIS — F0280 Dementia in other diseases classified elsewhere without behavioral disturbance: Secondary | ICD-10-CM | POA: Diagnosis not present

## 2019-03-07 DIAGNOSIS — Z299 Encounter for prophylactic measures, unspecified: Secondary | ICD-10-CM | POA: Diagnosis not present

## 2019-03-26 DIAGNOSIS — Z1331 Encounter for screening for depression: Secondary | ICD-10-CM | POA: Diagnosis not present

## 2019-03-26 DIAGNOSIS — Z6837 Body mass index (BMI) 37.0-37.9, adult: Secondary | ICD-10-CM | POA: Diagnosis not present

## 2019-03-26 DIAGNOSIS — Z299 Encounter for prophylactic measures, unspecified: Secondary | ICD-10-CM | POA: Diagnosis not present

## 2019-03-26 DIAGNOSIS — Z0000 Encounter for general adult medical examination without abnormal findings: Secondary | ICD-10-CM | POA: Diagnosis not present

## 2019-03-26 DIAGNOSIS — Z1339 Encounter for screening examination for other mental health and behavioral disorders: Secondary | ICD-10-CM | POA: Diagnosis not present

## 2019-05-19 DIAGNOSIS — I10 Essential (primary) hypertension: Secondary | ICD-10-CM | POA: Diagnosis not present

## 2019-05-19 DIAGNOSIS — I2510 Atherosclerotic heart disease of native coronary artery without angina pectoris: Secondary | ICD-10-CM | POA: Diagnosis not present

## 2019-05-19 DIAGNOSIS — J449 Chronic obstructive pulmonary disease, unspecified: Secondary | ICD-10-CM | POA: Diagnosis not present

## 2019-05-19 DIAGNOSIS — I509 Heart failure, unspecified: Secondary | ICD-10-CM | POA: Diagnosis not present

## 2019-06-05 DIAGNOSIS — F419 Anxiety disorder, unspecified: Secondary | ICD-10-CM | POA: Diagnosis not present

## 2019-06-05 DIAGNOSIS — Z299 Encounter for prophylactic measures, unspecified: Secondary | ICD-10-CM | POA: Diagnosis not present

## 2019-06-05 DIAGNOSIS — F0280 Dementia in other diseases classified elsewhere without behavioral disturbance: Secondary | ICD-10-CM | POA: Diagnosis not present

## 2019-06-05 DIAGNOSIS — R350 Frequency of micturition: Secondary | ICD-10-CM | POA: Diagnosis not present

## 2019-06-05 DIAGNOSIS — Z6837 Body mass index (BMI) 37.0-37.9, adult: Secondary | ICD-10-CM | POA: Diagnosis not present

## 2019-06-05 DIAGNOSIS — G309 Alzheimer's disease, unspecified: Secondary | ICD-10-CM | POA: Diagnosis not present

## 2019-09-12 DIAGNOSIS — F0280 Dementia in other diseases classified elsewhere without behavioral disturbance: Secondary | ICD-10-CM | POA: Diagnosis not present

## 2019-09-12 DIAGNOSIS — G4700 Insomnia, unspecified: Secondary | ICD-10-CM | POA: Diagnosis not present

## 2019-09-12 DIAGNOSIS — Z299 Encounter for prophylactic measures, unspecified: Secondary | ICD-10-CM | POA: Diagnosis not present

## 2019-09-12 DIAGNOSIS — F419 Anxiety disorder, unspecified: Secondary | ICD-10-CM | POA: Diagnosis not present

## 2019-09-12 DIAGNOSIS — Z6837 Body mass index (BMI) 37.0-37.9, adult: Secondary | ICD-10-CM | POA: Diagnosis not present

## 2019-09-12 DIAGNOSIS — G309 Alzheimer's disease, unspecified: Secondary | ICD-10-CM | POA: Diagnosis not present

## 2019-10-24 DIAGNOSIS — I270 Primary pulmonary hypertension: Secondary | ICD-10-CM | POA: Diagnosis not present

## 2019-10-24 DIAGNOSIS — J449 Chronic obstructive pulmonary disease, unspecified: Secondary | ICD-10-CM | POA: Diagnosis not present

## 2019-10-24 DIAGNOSIS — I10 Essential (primary) hypertension: Secondary | ICD-10-CM | POA: Diagnosis not present

## 2019-10-24 DIAGNOSIS — Z299 Encounter for prophylactic measures, unspecified: Secondary | ICD-10-CM | POA: Diagnosis not present

## 2019-10-24 DIAGNOSIS — I509 Heart failure, unspecified: Secondary | ICD-10-CM | POA: Diagnosis not present

## 2019-10-29 DIAGNOSIS — M25561 Pain in right knee: Secondary | ICD-10-CM | POA: Diagnosis not present

## 2019-10-29 DIAGNOSIS — M25562 Pain in left knee: Secondary | ICD-10-CM | POA: Diagnosis not present

## 2019-12-12 DIAGNOSIS — M791 Myalgia, unspecified site: Secondary | ICD-10-CM | POA: Diagnosis not present

## 2019-12-12 DIAGNOSIS — I10 Essential (primary) hypertension: Secondary | ICD-10-CM | POA: Diagnosis not present

## 2019-12-12 DIAGNOSIS — Z299 Encounter for prophylactic measures, unspecified: Secondary | ICD-10-CM | POA: Diagnosis not present

## 2019-12-12 DIAGNOSIS — G309 Alzheimer's disease, unspecified: Secondary | ICD-10-CM | POA: Diagnosis not present

## 2019-12-12 DIAGNOSIS — I739 Peripheral vascular disease, unspecified: Secondary | ICD-10-CM | POA: Diagnosis not present

## 2020-01-01 DIAGNOSIS — G4700 Insomnia, unspecified: Secondary | ICD-10-CM | POA: Diagnosis not present

## 2020-01-01 DIAGNOSIS — Z299 Encounter for prophylactic measures, unspecified: Secondary | ICD-10-CM | POA: Diagnosis not present

## 2020-01-01 DIAGNOSIS — M25562 Pain in left knee: Secondary | ICD-10-CM | POA: Diagnosis not present

## 2020-01-01 DIAGNOSIS — I509 Heart failure, unspecified: Secondary | ICD-10-CM | POA: Diagnosis not present

## 2020-01-01 DIAGNOSIS — M25561 Pain in right knee: Secondary | ICD-10-CM | POA: Diagnosis not present

## 2020-01-02 DIAGNOSIS — I10 Essential (primary) hypertension: Secondary | ICD-10-CM | POA: Diagnosis not present

## 2020-01-02 DIAGNOSIS — R262 Difficulty in walking, not elsewhere classified: Secondary | ICD-10-CM | POA: Diagnosis not present

## 2020-01-02 DIAGNOSIS — M2560 Stiffness of unspecified joint, not elsewhere classified: Secondary | ICD-10-CM | POA: Diagnosis not present

## 2020-01-02 DIAGNOSIS — M25562 Pain in left knee: Secondary | ICD-10-CM | POA: Diagnosis not present

## 2020-01-02 DIAGNOSIS — Z299 Encounter for prophylactic measures, unspecified: Secondary | ICD-10-CM | POA: Diagnosis not present

## 2020-01-02 DIAGNOSIS — M25561 Pain in right knee: Secondary | ICD-10-CM | POA: Diagnosis not present

## 2020-01-09 DIAGNOSIS — F0280 Dementia in other diseases classified elsewhere without behavioral disturbance: Secondary | ICD-10-CM | POA: Diagnosis not present

## 2020-01-09 DIAGNOSIS — G309 Alzheimer's disease, unspecified: Secondary | ICD-10-CM | POA: Diagnosis not present

## 2020-01-09 DIAGNOSIS — G8929 Other chronic pain: Secondary | ICD-10-CM | POA: Diagnosis not present

## 2020-01-09 DIAGNOSIS — I509 Heart failure, unspecified: Secondary | ICD-10-CM | POA: Diagnosis not present

## 2020-01-09 DIAGNOSIS — Z299 Encounter for prophylactic measures, unspecified: Secondary | ICD-10-CM | POA: Diagnosis not present

## 2020-01-13 DIAGNOSIS — G894 Chronic pain syndrome: Secondary | ICD-10-CM | POA: Diagnosis not present

## 2020-01-13 DIAGNOSIS — M25561 Pain in right knee: Secondary | ICD-10-CM | POA: Diagnosis not present

## 2020-01-28 DIAGNOSIS — I10 Essential (primary) hypertension: Secondary | ICD-10-CM | POA: Diagnosis not present

## 2020-01-28 DIAGNOSIS — I509 Heart failure, unspecified: Secondary | ICD-10-CM | POA: Diagnosis not present

## 2020-01-28 DIAGNOSIS — J449 Chronic obstructive pulmonary disease, unspecified: Secondary | ICD-10-CM | POA: Diagnosis not present

## 2020-01-28 DIAGNOSIS — I2510 Atherosclerotic heart disease of native coronary artery without angina pectoris: Secondary | ICD-10-CM | POA: Diagnosis not present

## 2020-02-19 DIAGNOSIS — G894 Chronic pain syndrome: Secondary | ICD-10-CM | POA: Diagnosis not present

## 2020-02-19 DIAGNOSIS — K5903 Drug induced constipation: Secondary | ICD-10-CM | POA: Diagnosis not present

## 2020-02-20 DIAGNOSIS — M069 Rheumatoid arthritis, unspecified: Secondary | ICD-10-CM | POA: Diagnosis not present

## 2020-02-20 DIAGNOSIS — M25562 Pain in left knee: Secondary | ICD-10-CM | POA: Diagnosis not present

## 2020-02-20 DIAGNOSIS — M25561 Pain in right knee: Secondary | ICD-10-CM | POA: Diagnosis not present

## 2020-02-23 ENCOUNTER — Other Ambulatory Visit: Payer: Self-pay | Admitting: Orthopedic Surgery

## 2020-02-23 ENCOUNTER — Other Ambulatory Visit (HOSPITAL_COMMUNITY): Payer: Self-pay | Admitting: Orthopedic Surgery

## 2020-02-23 DIAGNOSIS — M25561 Pain in right knee: Secondary | ICD-10-CM

## 2020-02-23 DIAGNOSIS — M25562 Pain in left knee: Secondary | ICD-10-CM

## 2020-02-27 DIAGNOSIS — I2510 Atherosclerotic heart disease of native coronary artery without angina pectoris: Secondary | ICD-10-CM | POA: Diagnosis not present

## 2020-02-27 DIAGNOSIS — J449 Chronic obstructive pulmonary disease, unspecified: Secondary | ICD-10-CM | POA: Diagnosis not present

## 2020-02-27 DIAGNOSIS — I10 Essential (primary) hypertension: Secondary | ICD-10-CM | POA: Diagnosis not present

## 2020-02-27 DIAGNOSIS — I509 Heart failure, unspecified: Secondary | ICD-10-CM | POA: Diagnosis not present

## 2020-03-02 DIAGNOSIS — M25561 Pain in right knee: Secondary | ICD-10-CM | POA: Diagnosis not present

## 2020-03-02 DIAGNOSIS — M25562 Pain in left knee: Secondary | ICD-10-CM | POA: Diagnosis not present

## 2020-03-03 ENCOUNTER — Encounter (HOSPITAL_COMMUNITY)
Admission: RE | Admit: 2020-03-03 | Discharge: 2020-03-03 | Disposition: A | Discharge status: Home / Self Care | Payer: Medicare Other | Source: Ambulatory Visit | Attending: Orthopedic Surgery | Admitting: Orthopedic Surgery

## 2020-03-03 ENCOUNTER — Other Ambulatory Visit: Payer: Self-pay

## 2020-03-03 DIAGNOSIS — M25561 Pain in right knee: Secondary | ICD-10-CM

## 2020-03-03 DIAGNOSIS — M25562 Pain in left knee: Secondary | ICD-10-CM

## 2020-03-03 MED ORDER — TECHNETIUM TC 99M MEDRONATE IV KIT: 20.0000 | PACK | Freq: Once | INTRAVENOUS | Status: DC | PRN

## 2020-03-05 DIAGNOSIS — G8928 Other chronic postprocedural pain: Secondary | ICD-10-CM | POA: Diagnosis not present

## 2020-03-05 DIAGNOSIS — M25562 Pain in left knee: Secondary | ICD-10-CM | POA: Diagnosis not present

## 2020-03-05 DIAGNOSIS — G894 Chronic pain syndrome: Secondary | ICD-10-CM | POA: Diagnosis not present

## 2020-03-05 DIAGNOSIS — M25561 Pain in right knee: Secondary | ICD-10-CM | POA: Diagnosis not present

## 2020-03-05 DIAGNOSIS — Z79899 Other long term (current) drug therapy: Secondary | ICD-10-CM | POA: Diagnosis not present

## 2020-03-05 DIAGNOSIS — Z5181 Encounter for therapeutic drug level monitoring: Secondary | ICD-10-CM | POA: Diagnosis not present

## 2020-03-16 DIAGNOSIS — M25561 Pain in right knee: Secondary | ICD-10-CM | POA: Diagnosis not present

## 2020-03-16 DIAGNOSIS — M25562 Pain in left knee: Secondary | ICD-10-CM | POA: Diagnosis not present

## 2020-03-16 DIAGNOSIS — G894 Chronic pain syndrome: Secondary | ICD-10-CM | POA: Diagnosis not present

## 2020-03-16 DIAGNOSIS — G8928 Other chronic postprocedural pain: Secondary | ICD-10-CM | POA: Diagnosis not present

## 2020-03-22 DIAGNOSIS — T8484XA Pain due to internal orthopedic prosthetic devices, implants and grafts, initial encounter: Secondary | ICD-10-CM | POA: Diagnosis not present

## 2020-03-23 DIAGNOSIS — M25561 Pain in right knee: Secondary | ICD-10-CM | POA: Diagnosis not present

## 2020-03-23 DIAGNOSIS — Z299 Encounter for prophylactic measures, unspecified: Secondary | ICD-10-CM | POA: Diagnosis not present

## 2020-03-23 DIAGNOSIS — I270 Primary pulmonary hypertension: Secondary | ICD-10-CM | POA: Diagnosis not present

## 2020-03-23 DIAGNOSIS — I739 Peripheral vascular disease, unspecified: Secondary | ICD-10-CM | POA: Diagnosis not present

## 2020-03-23 DIAGNOSIS — J449 Chronic obstructive pulmonary disease, unspecified: Secondary | ICD-10-CM | POA: Diagnosis not present

## 2020-03-24 DIAGNOSIS — E559 Vitamin D deficiency, unspecified: Secondary | ICD-10-CM | POA: Diagnosis not present

## 2020-03-24 DIAGNOSIS — I10 Essential (primary) hypertension: Secondary | ICD-10-CM | POA: Diagnosis not present

## 2020-03-24 DIAGNOSIS — Z79899 Other long term (current) drug therapy: Secondary | ICD-10-CM | POA: Diagnosis not present

## 2020-03-24 DIAGNOSIS — Z6834 Body mass index (BMI) 34.0-34.9, adult: Secondary | ICD-10-CM | POA: Diagnosis not present

## 2020-03-24 DIAGNOSIS — Z0000 Encounter for general adult medical examination without abnormal findings: Secondary | ICD-10-CM | POA: Diagnosis not present

## 2020-03-24 DIAGNOSIS — Z299 Encounter for prophylactic measures, unspecified: Secondary | ICD-10-CM | POA: Diagnosis not present

## 2020-03-24 DIAGNOSIS — Z7189 Other specified counseling: Secondary | ICD-10-CM | POA: Diagnosis not present

## 2020-03-24 DIAGNOSIS — I509 Heart failure, unspecified: Secondary | ICD-10-CM | POA: Diagnosis not present

## 2020-03-24 DIAGNOSIS — R5383 Other fatigue: Secondary | ICD-10-CM | POA: Diagnosis not present

## 2020-03-24 DIAGNOSIS — Z1339 Encounter for screening examination for other mental health and behavioral disorders: Secondary | ICD-10-CM | POA: Diagnosis not present

## 2020-03-24 DIAGNOSIS — Z1331 Encounter for screening for depression: Secondary | ICD-10-CM | POA: Diagnosis not present

## 2020-03-29 DIAGNOSIS — Z23 Encounter for immunization: Secondary | ICD-10-CM | POA: Diagnosis not present

## 2020-04-01 DIAGNOSIS — G894 Chronic pain syndrome: Secondary | ICD-10-CM | POA: Diagnosis not present

## 2020-04-02 ENCOUNTER — Ambulatory Visit: Payer: Self-pay | Admitting: Student

## 2020-04-02 DIAGNOSIS — Z79899 Other long term (current) drug therapy: Secondary | ICD-10-CM | POA: Diagnosis not present

## 2020-04-02 DIAGNOSIS — E8940 Asymptomatic postprocedural ovarian failure: Secondary | ICD-10-CM | POA: Diagnosis not present

## 2020-04-06 NOTE — Patient Instructions (Addendum)
DUE TO COVID-19 ONLY ONE VISITOR IS ALLOWED TO COME WITH YOU AND STAY IN THE WAITING ROOM ONLY DURING PRE OP AND PROCEDURE DAY OF SURGERY. THE 1 VISITOR  MAY VISIT WITH YOU AFTER SURGERY IN YOUR PRIVATE ROOM DURING VISITING HOURS ONLY!  YOU NEED TO HAVE A COVID 19 TEST ON__9/11_____ @_11 :30______, THIS TEST MUST BE DONE BEFORE SURGERY,  COVID TESTING SITE [address] [place] [address], IT IS ON THE RIGHT GOING OUT [address] APPROXIMATELY  2 MINUTES PAST [employer] ON THE RIGHT. ONCE YOUR COVID TEST IS COMPLETED,  PLEASE BEGIN THE QUARANTINE INSTRUCTIONS AS OUTLINED IN YOUR HANDOUT.                Jessica Lopez    Your procedure is scheduled on: 04/14/20   Report to [facility] Main  Entrance   Report to admitting at 9:15 AM     Call this number if you have problems the morning of surgery [phone]    BRUSH YOUR TEETH MORNING OF SURGERY AND RINSE YOUR MOUTH OUT, NO CHEWING GUM CANDY OR MINTS.   No food after midnight.    You may have clear liquid until 8:30 AM.    At 8:30 AM drink pre surgery drink  . Nothing by mouth after 8:30 AM.   Take these medicines the morning of surgery with A SIP OF WATER: Aricept, Gabapentin, Buspirone   use your inhaler and bring it with you to the hospital                                 You may not have any metal on your body including hair pins and              piercings  Do not wear jewelry, make-up, lotions, powders or perfumes, deodorant             Do not wear nail polish on your fingernails.  Do not shave  48 hours prior to surgery.            Do not bring valuables to the hospital. [facility] IS NOT             RESPONSIBLE   FOR VALUABLES.  Contacts, dentures or bridgework may not be worn into surgery.       Patients discharged the day of surgery will not be allowed to drive home. IF YOU ARE HAVING SURGERY AND GOING HOME THE SAME DAY, YOU MUST HAVE AN ADULT TO DRIVE YOU HOME AND BE WITH YOU FOR  24 HOURS. YOU MAY GO HOME BY TAXI OR UBER OR ORTHERWISE, BUT AN ADULT MUST ACCOMPANY YOU HOME AND STAY WITH YOU FOR 24 HOURS.  Name and phone number of your driver:  Special Instructions: N/A              Please read over the following fact sheets you were given: _____________________________________________________________________             [facility] - Preparing for Surgery Before surgery, you can play an important role.  Because skin is not sterile, your skin needs to be as free of germs as possible.  You can reduce the number of germs on your skin by washing with CHG (chlorahexidine gluconate) soap before surgery.  CHG is an antiseptic cleaner which kills germs and bonds with the skin to continue killing germs even after washing. Please DO  NOT use if you have an allergy to CHG or antibacterial soaps.  If your skin becomes reddened/irritated stop using the CHG and inform your nurse when you arrive at Short Stay. Do not shave (including legs and underarms) for at least 48 hours prior to the first CHG shower. Please follow these instructions carefully:  1.  Shower with CHG Soap the night before surgery and the  morning of Surgery.  2.  If you choose to wash your hair, wash your hair first as usual with your  normal  shampoo.  3.  After you shampoo, rinse your hair and body thoroughly to remove the  shampoo.                                        4.  Use CHG as you would any other liquid soap.  You can apply chg directly  to the skin and wash                       Gently with a scrungie or clean washcloth.  5.  Apply the CHG Soap to your body ONLY FROM THE NECK DOWN.   Do not use on face/ open                           Wound or open sores. Avoid contact with eyes, ears mouth and genitals (private parts).                       Wash face,  Genitals (private parts) with your normal soap.             6.  Wash thoroughly, paying special attention to the area where your surgery  will be  performed.  7.  Thoroughly rinse your body with warm water from the neck down.  8.  DO NOT shower/wash with your normal soap after using and rinsing off  the CHG Soap.             9.  Pat yourself dry with a clean towel.            10.  Wear clean pajamas.            11.  Place clean sheets on your bed the night of your first shower and do not  sleep with pets. Day of Surgery : Do not apply any lotions/deodorants the morning of surgery.  Please wear clean clothes to the hospital/surgery center.  FAILURE TO FOLLOW THESE INSTRUCTIONS MAY RESULT IN THE CANCELLATION OF YOUR SURGERY PATIENT SIGNATURE_________________________________  NURSE SIGNATURE__________________________________  ________________________________________________________________________   Jessica Lopez  An incentive spirometer is a tool that can help keep your lungs clear and active. This tool measures how well you are filling your lungs with each breath. Taking long deep breaths may help reverse or decrease the chance of developing breathing (pulmonary) problems (especially infection) following:  A long period of time when you are unable to move or be active. BEFORE THE PROCEDURE   If the spirometer includes an indicator to show your best effort, your nurse or respiratory therapist will set it to a desired goal.  If possible, sit up straight or lean slightly forward. Try not to slouch.  Hold the incentive spirometer in an upright position. INSTRUCTIONS FOR USE  1. Sit on the edge of  your bed if possible, or sit up as far as you can in bed or on a chair. 2. Hold the incentive spirometer in an upright position. 3. Breathe out normally. 4. Place the mouthpiece in your mouth and seal your lips tightly around it. 5. Breathe in slowly and as deeply as possible, raising the piston or the ball toward the top of the column. 6. Hold your breath for 3-5 seconds or for as long as possible. Allow the piston or ball to fall  to the bottom of the column. 7. Remove the mouthpiece from your mouth and breathe out normally. 8. Rest for a few seconds and repeat Steps 1 through 7 at least 10 times every 1-2 hours when you are awake. Take your time and take a few normal breaths between deep breaths. 9. The spirometer may include an indicator to show your best effort. Use the indicator as a goal to work toward during each repetition. 10. After each set of 10 deep breaths, practice coughing to be sure your lungs are clear. If you have an incision (the cut made at the time of surgery), support your incision when coughing by placing a pillow or rolled up towels firmly against it. Once you are able to get out of bed, walk around indoors and cough well. You may stop using the incentive spirometer when instructed by your caregiver.  RISKS AND COMPLICATIONS  Take your time so you do not get dizzy or light-headed.  If you are in pain, you may need to take or ask for pain medication before doing incentive spirometry. It is harder to take a deep breath if you are having pain. AFTER USE  Rest and breathe slowly and easily.  It can be helpful to keep track of a log of your progress. Your caregiver can provide you with a simple table to help with this. If you are using the spirometer at home, follow these instructions: SEEK MEDICAL CARE IF:   You are having difficultly using the spirometer.  You have trouble using the spirometer as often as instructed.  Your pain medication is not giving enough relief while using the spirometer.  You develop fever of 100.5 F (38.1 C) or higher. SEEK IMMEDIATE MEDICAL CARE IF:   You cough up bloody sputum that had not been present before.  You develop fever of 102 F (38.9 C) or greater.  You develop worsening pain at or near the incision site. MAKE SURE YOU:   Understand these instructions.  Will watch your condition.  Will get help right away if you are not doing well or get  worse. Document Released: 11/27/2006 Document Revised: 10/09/2011 Document Reviewed: 01/28/2007 [facility] Patient Information 2014 [place], [place].   ________________________________________________________________________

## 2020-04-07 ENCOUNTER — Encounter (HOSPITAL_COMMUNITY): Payer: Self-pay

## 2020-04-07 ENCOUNTER — Other Ambulatory Visit: Payer: Self-pay

## 2020-04-07 ENCOUNTER — Encounter (HOSPITAL_COMMUNITY)
Admission: RE | Admit: 2020-04-07 | Discharge: 2020-04-07 | Disposition: A | Discharge status: Home / Self Care | Payer: Medicare Other | Source: Ambulatory Visit | Attending: Orthopedic Surgery | Admitting: Orthopedic Surgery

## 2020-04-07 ENCOUNTER — Ambulatory Visit: Payer: Self-pay | Admitting: Student

## 2020-04-07 DIAGNOSIS — Z01812 Encounter for preprocedural laboratory examination: Secondary | ICD-10-CM | POA: Insufficient documentation

## 2020-04-07 HISTORY — DX: Angina pectoris, unspecified: I20.9

## 2020-04-07 HISTORY — DX: Chronic obstructive pulmonary disease, unspecified: J44.9

## 2020-04-07 LAB — URINALYSIS, ROUTINE W REFLEX MICROSCOPIC
Bacteria, UA: NONE SEEN
Bilirubin Urine: NEGATIVE
Glucose, UA: NEGATIVE mg/dL
Hgb urine dipstick: NEGATIVE
Ketones, ur: NEGATIVE mg/dL
Nitrite: NEGATIVE
Protein, ur: 30 mg/dL — AB
Specific Gravity, Urine: 1.027 (ref 1.005–1.030)
pH: 5 (ref 5.0–8.0)

## 2020-04-07 LAB — COMPREHENSIVE METABOLIC PANEL
ALT: 11 U/L (ref 0–44)
AST: 18 U/L (ref 15–41)
Albumin: 4.3 g/dL (ref 3.5–5.0)
Alkaline Phosphatase: 111 U/L (ref 38–126)
Anion gap: 10 (ref 5–15)
BUN: 13 mg/dL (ref 8–23)
CO2: 26 mmol/L (ref 22–32)
Calcium: 9.9 mg/dL (ref 8.9–10.3)
Chloride: 103 mmol/L (ref 98–111)
Creatinine, Ser: 0.89 mg/dL (ref 0.44–1.00)
GFR calc Af Amer: >60 mL/min (ref >60)
GFR calc non Af Amer: >60 mL/min (ref >60)
Glucose, Bld: 94 mg/dL (ref 70–99)
Potassium: 4.2 mmol/L (ref 3.5–5.1)
Sodium: 139 mmol/L (ref 135–145)
Total Bilirubin: 0.5 mg/dL (ref 0.3–1.2)
Total Protein: 7.9 g/dL (ref 6.5–8.1)

## 2020-04-07 LAB — CBC
HCT: 44 % (ref 36.0–46.0)
Hemoglobin: 13.9 g/dL (ref 12.0–15.0)
MCH: 29 pg (ref 26.0–34.0)
MCHC: 31.6 g/dL (ref 30.0–36.0)
MCV: 91.7 fL (ref 80.0–100.0)
Platelets: 294 10*3/uL (ref 150–400)
RBC: 4.8 MIL/uL (ref 3.87–5.11)
RDW: 12.7 % (ref 11.5–15.5)
WBC: 8.8 10*3/uL (ref 4.0–10.5)
nRBC: 0 % (ref 0.0–0.2)

## 2020-04-07 LAB — PROTIME-INR
INR: 0.9 (ref 0.8–1.2)
Prothrombin Time: 12 seconds (ref 11.4–15.2)

## 2020-04-07 LAB — SURGICAL PCR SCREEN
MRSA, PCR: NEGATIVE
Staphylococcus aureus: NEGATIVE

## 2020-04-07 NOTE — H&P (Signed)
TOTAL KNEE REVISION ADMISSION H&P  Patient is being admitted for right revision total knee arthroplasty.  Subjective:  Chief Complaint:right knee pain.  HPI: Jessica Lopez, 79 y.o. female, has a history of pain and functional disability in the right knee(s) due to failed previous arthroplasty and patient has failed non-surgical conservative treatments for greater than 12 weeks to include NSAID's and/or analgesics and activity modification. The indications for the revision of the total knee arthroplasty are loosening of one or more components. Onset of symptoms was gradual starting 2 years ago with gradually worsening course since that time.  Prior procedures on the right knee(s) include arthroplasty.  Patient currently rates pain in the right knee(s) at 10 out of 10 with activity. There is worsening of pain with activity and weight bearing, pain that interferes with activities of daily living and pain with passive range of motion.  Patient has evidence of prosthetic loosening by imaging studies. This condition presents safety issues increasing the risk of falls. There is no current active infection.  Patient Active Problem List   Diagnosis Date Noted  . Left knee pain 04/17/2013  . Chest pain 04/17/2013  . Hx of total knee arthroplasty   . Hypertension   . Anxiety   . GERD (gastroesophageal reflux disease)   . Headache(784.0)   . Left knee DJD 04/10/2013  . Abnormal weight gain 01/27/2013  . Screening for colon cancer 11/11/2011  . Unspecified constipation 11/11/2011  . Cervical adenopathy 11/11/2011   Past Medical History:  Diagnosis Date  . Anemia   . Anxiety   . CHF (congestive heart failure)   . Chronic constipation   . GERD (gastroesophageal reflux disease)   . Headache(784.0)    migraines  . Hypertension   . Lower back pain   . Osteoarthritis     Past Surgical History:  Procedure Laterality Date  . ABDOMINAL HYSTERECTOMY    . COLONOSCOPY  11/30/2011   MLY:YTKP  papilla/internal hemorrhoids.  Rectum and colon appeared otherwise normal  . KNEE SURGERY     right  . TOTAL KNEE ARTHROPLASTY Left 04/10/2013   Procedure: LEFT TOTAL KNEE ARTHROPLASTY;  Surgeon: Javier Docker, MD;  Location: [facility];  Service: Orthopedics;  Laterality: Left;    Current Outpatient Medications  Medication Sig Dispense Refill Last Dose  . acetaminophen (TYLENOL) 650 MG CR tablet Take 1,300 mg by mouth every 8 (eight) hours as needed for pain.     Marland Kitchen amLODipine-benazepril (LOTREL) 5-20 MG per capsule Take 1 capsule by mouth every morning.      . busPIRone (BUSPAR) 15 MG tablet Take 15 mg by mouth every 8 (eight) hours as needed (anxiety).     . clonazePAM (KLONOPIN) 0.5 MG tablet Take one tablet by mouth at bedtime as needed for anxiety/sleep; Take two tablets by mouth at bedtime as needed for anxiety/sleep (Patient taking differently: Take 0.5 mg by mouth daily. ) 20 tablet 0   . donepezil (ARICEPT) 10 MG tablet Take 10 mg by mouth daily.     . folic acid (FOLVITE) 1 MG tablet Take 1 mg by mouth daily.     . furosemide (LASIX) 20 MG tablet Take 20 mg by mouth daily.     Marland Kitchen gabapentin (NEURONTIN) 300 MG capsule Take 300-600 mg by mouth See admin instructions. Take 600 mg in the morning and 300 mg in the evening     . ibuprofen (ADVIL) 800 MG tablet Take 800 mg by mouth every 8 (eight) hours as  needed for moderate pain.     Marland Kitchen KLOR-CON M20 20 MEQ tablet Take 20 mEq by mouth daily.      . Linaclotide (LINZESS) 290 MCG CAPS Take 290 mcg by mouth daily. (Patient not taking: Reported on 04/06/2020) 30 capsule 11   . Menthol-Methyl Salicylate (MUSCLE RUB EX) Apply 1 application topically daily as needed (pain).     . metoprolol tartrate (LOPRESSOR) 25 MG tablet Take 1 tablet (25 mg total) by mouth 2 (two) times daily. (Patient not taking: Reported on 04/06/2020) 60 tablet 0   . Omega-3 Fatty Acids (FISH OIL PO) Take 1 capsule by mouth daily.     Marland Kitchen oxazepam (SERAX) 15 MG capsule Take 15 mg by  mouth at bedtime.     . rivaroxaban (XARELTO) 10 MG TABS tablet Take 1 tablet (10 mg total) by mouth daily. Can stop once abulatory (Patient not taking: Reported on 04/06/2020) 14 tablet 0   . SUBOXONE 12-3 MG FILM Place 1 Film under the tongue 3 (three) times daily.     . SYMBICORT 160-4.5 MCG/ACT inhaler Inhale 1 puff into the lungs 2 (two) times daily as needed for shortness of breath.     . traMADol (ULTRAM) 50 MG tablet Take 50 mg by mouth every 8 (eight) hours as needed for moderate pain.     . Vitamin D, Ergocalciferol, (DRISDOL) 1.25 MG (50000 UNIT) CAPS capsule Take 50,000 Units by mouth every 7 (seven) days.      No current facility-administered medications for this visit.   Allergies  Allergen Reactions  . Dilaudid [Hydromorphone]     "made my heart hurt"   . Hydrocodone Itching    Social History   Tobacco Use  . Smoking status: Former Smoker    Packs/day: 0.50    Types: Cigarettes  . Smokeless tobacco: Former Neurosurgeon    Quit date: 10/09/1964  . Tobacco comment: quit about 35+ years ago  Substance Use Topics  . Alcohol use: Yes    Comment: very little(wine)    Family History  Problem Relation Age of Onset  . Colon cancer Neg Hx       Review of Systems  Constitutional: Negative.   HENT: Negative.   Eyes: Negative.   Respiratory: Negative.   Cardiovascular: Negative.   Gastrointestinal: Positive for constipation.  Endocrine: Negative.   Genitourinary: Negative.   Musculoskeletal: Positive for arthralgias.  Skin: Negative.   Allergic/Immunologic: Negative.   Neurological: Negative.   Hematological: Negative.   Psychiatric/Behavioral: Negative.      Objective:  Physical Exam Constitutional:      Appearance: Normal appearance.  HENT:     Head: Normocephalic.  Eyes:     Pupils: Pupils are equal, round, and reactive to light.  Cardiovascular:     Rate and Rhythm: Normal rate and regular rhythm.     Pulses: Normal pulses.  Pulmonary:     Effort: Pulmonary  effort is normal.     Breath sounds: Normal breath sounds.  Abdominal:     Palpations: Abdomen is soft.     Tenderness: There is no abdominal tenderness.  Genitourinary:    Comments: Deferred Musculoskeletal:     Cervical back: Normal range of motion.     Comments: Examination of the right knee reveals a healed anterior incision. No erythema or effusion. No warmth. No swelling. She has focal tenderness to palpation over the tibial and femoral components. The knee is very stiff. Her range of motion is 10 to 75 degrees. There  is no instability. Painless range of motion of the hip.  Skin:    General: Skin is warm and dry.  Neurological:     Mental Status: She is alert. Mental status is at baseline.  Psychiatric:        Mood and Affect: Mood normal.     Vital signs in last 24 hours: @VSRANGES @  Labs:  Estimated body mass index is 36.22 kg/m as calculated from the following:   Height as of 04/17/13: 5\' 6"  (1.676 m).   Weight as of 04/17/13: 101.8 kg.  Imaging Review Plain radiographs demonstrate severe degenerative joint disease of the right knee(s). The overall alignment is neutral.There is evidence of loosening of the femoral and tibial components. The bone quality appears to be adequate for age and reported activity level.     Assessment/Plan:  End stage arthritis, right knee(s) with failed previous arthroplasty.   The patient history, physical examination, clinical judgment of the provider and imaging studies are consistent with end stage degenerative joint disease of the right knee(s), previous total knee arthroplasty. Revision total knee arthroplasty is deemed medically necessary. The treatment options including medical management, injection therapy, arthroscopy and revision arthroplasty were discussed at length. The risks and benefits of revision total knee arthroplasty were presented and reviewed. The risks due to aseptic loosening, infection, stiffness, patella tracking  problems, thromboembolic complications and other imponderables were discussed. The patient acknowledged the explanation, agreed to proceed with the plan and consent was signed. Patient is being admitted for inpatient treatment for surgery, pain control, PT, OT, prophylactic antibiotics, VTE prophylaxis, progressive ambulation and ADL's and discharge planning.The patient is planning to be discharged home and will complete outpatient physical therapy.

## 2020-04-07 NOTE — H&P (View-Only) (Signed)
TOTAL KNEE REVISION ADMISSION H&P  Patient is being admitted for right revision total knee arthroplasty.  Subjective:  Chief Complaint:right knee pain.  HPI: Jessica Lopez, 78 y.o. female, has a history of pain and functional disability in the right knee(s) due to failed previous arthroplasty and patient has failed non-surgical conservative treatments for greater than 12 weeks to include NSAID's and/or analgesics and activity modification. The indications for the revision of the total knee arthroplasty are loosening of one or more components. Onset of symptoms was gradual starting 2 years ago with gradually worsening course since that time.  Prior procedures on the right knee(s) include arthroplasty.  Patient currently rates pain in the right knee(s) at 10 out of 10 with activity. There is worsening of pain with activity and weight bearing, pain that interferes with activities of daily living and pain with passive range of motion.  Patient has evidence of prosthetic loosening by imaging studies. This condition presents safety issues increasing the risk of falls. There is no current active infection.  Patient Active Problem List   Diagnosis Date Noted  . Left knee pain 04/17/2013  . Chest pain 04/17/2013  . Hx of total knee arthroplasty   . Hypertension   . Anxiety   . GERD (gastroesophageal reflux disease)   . Headache(784.0)   . Left knee DJD 04/10/2013  . Abnormal weight gain 01/27/2013  . Screening for colon cancer 11/11/2011  . Unspecified constipation 11/11/2011  . Cervical adenopathy 11/11/2011   Past Medical History:  Diagnosis Date  . Anemia   . Anxiety   . CHF (congestive heart failure)   . Chronic constipation   . GERD (gastroesophageal reflux disease)   . Headache(784.0)    migraines  . Hypertension   . Lower back pain   . Osteoarthritis     Past Surgical History:  Procedure Laterality Date  . ABDOMINAL HYSTERECTOMY    . COLONOSCOPY  11/30/2011   RMR:Anal  papilla/internal hemorrhoids.  Rectum and colon appeared otherwise normal  . KNEE SURGERY     right  . TOTAL KNEE ARTHROPLASTY Left 04/10/2013   Procedure: LEFT TOTAL KNEE ARTHROPLASTY;  Surgeon: Jeffrey C Beane, MD;  Location: [facility];  Service: Orthopedics;  Laterality: Left;    Current Outpatient Medications  Medication Sig Dispense Refill Last Dose  . acetaminophen (TYLENOL) 650 MG CR tablet Take 1,300 mg by mouth every 8 (eight) hours as needed for pain.     . amLODipine-benazepril (LOTREL) 5-20 MG per capsule Take 1 capsule by mouth every morning.      . busPIRone (BUSPAR) 15 MG tablet Take 15 mg by mouth every 8 (eight) hours as needed (anxiety).     . clonazePAM (KLONOPIN) 0.5 MG tablet Take one tablet by mouth at bedtime as needed for anxiety/sleep; Take two tablets by mouth at bedtime as needed for anxiety/sleep (Patient taking differently: Take 0.5 mg by mouth daily. ) 20 tablet 0   . donepezil (ARICEPT) 10 MG tablet Take 10 mg by mouth daily.     . folic acid (FOLVITE) 1 MG tablet Take 1 mg by mouth daily.     . furosemide (LASIX) 20 MG tablet Take 20 mg by mouth daily.     . gabapentin (NEURONTIN) 300 MG capsule Take 300-600 mg by mouth See admin instructions. Take 600 mg in the morning and 300 mg in the evening     . ibuprofen (ADVIL) 800 MG tablet Take 800 mg by mouth every 8 (eight) hours as   needed for moderate pain.     Marland Kitchen KLOR-CON M20 20 MEQ tablet Take 20 mEq by mouth daily.      . Linaclotide (LINZESS) 290 MCG CAPS Take 290 mcg by mouth daily. (Patient not taking: Reported on 04/06/2020) 30 capsule 11   . Menthol-Methyl Salicylate (MUSCLE RUB EX) Apply 1 application topically daily as needed (pain).     . metoprolol tartrate (LOPRESSOR) 25 MG tablet Take 1 tablet (25 mg total) by mouth 2 (two) times daily. (Patient not taking: Reported on 04/06/2020) 60 tablet 0   . Omega-3 Fatty Acids (FISH OIL PO) Take 1 capsule by mouth daily.     Marland Kitchen oxazepam (SERAX) 15 MG capsule Take 15 mg by  mouth at bedtime.     . rivaroxaban (XARELTO) 10 MG TABS tablet Take 1 tablet (10 mg total) by mouth daily. Can stop once abulatory (Patient not taking: Reported on 04/06/2020) 14 tablet 0   . SUBOXONE 12-3 MG FILM Place 1 Film under the tongue 3 (three) times daily.     . SYMBICORT 160-4.5 MCG/ACT inhaler Inhale 1 puff into the lungs 2 (two) times daily as needed for shortness of breath.     . traMADol (ULTRAM) 50 MG tablet Take 50 mg by mouth every 8 (eight) hours as needed for moderate pain.     . Vitamin D, Ergocalciferol, (DRISDOL) 1.25 MG (50000 UNIT) CAPS capsule Take 50,000 Units by mouth every 7 (seven) days.      No current facility-administered medications for this visit.   Allergies  Allergen Reactions  . Dilaudid [Hydromorphone]     "made my heart hurt"   . Hydrocodone Itching    Social History   Tobacco Use  . Smoking status: Former Smoker    Packs/day: 0.50    Types: Cigarettes  . Smokeless tobacco: Former Neurosurgeon    Quit date: 10/09/1964  . Tobacco comment: quit about 35+ years ago  Substance Use Topics  . Alcohol use: Yes    Comment: very little(wine)    Family History  Problem Relation Age of Onset  . Colon cancer Neg Hx       Review of Systems  Constitutional: Negative.   HENT: Negative.   Eyes: Negative.   Respiratory: Negative.   Cardiovascular: Negative.   Gastrointestinal: Positive for constipation.  Endocrine: Negative.   Genitourinary: Negative.   Musculoskeletal: Positive for arthralgias.  Skin: Negative.   Allergic/Immunologic: Negative.   Neurological: Negative.   Hematological: Negative.   Psychiatric/Behavioral: Negative.      Objective:  Physical Exam Constitutional:      Appearance: Normal appearance.  HENT:     Head: Normocephalic.  Eyes:     Pupils: Pupils are equal, round, and reactive to light.  Cardiovascular:     Rate and Rhythm: Normal rate and regular rhythm.     Pulses: Normal pulses.  Pulmonary:     Effort: Pulmonary  effort is normal.     Breath sounds: Normal breath sounds.  Abdominal:     Palpations: Abdomen is soft.     Tenderness: There is no abdominal tenderness.  Genitourinary:    Comments: Deferred Musculoskeletal:     Cervical back: Normal range of motion.     Comments: Examination of the right knee reveals a healed anterior incision. No erythema or effusion. No warmth. No swelling. She has focal tenderness to palpation over the tibial and femoral components. The knee is very stiff. Her range of motion is 10 to 75 degrees. There  is no instability. Painless range of motion of the hip.  Skin:    General: Skin is warm and dry.  Neurological:     Mental Status: She is alert. Mental status is at baseline.  Psychiatric:        Mood and Affect: Mood normal.     Vital signs in last 24 hours: @VSRANGES @  Labs:  Estimated body mass index is 36.22 kg/m as calculated from the following:   Height as of 04/17/13: 5\' 6"  (1.676 m).   Weight as of 04/17/13: 101.8 kg.  Imaging Review Plain radiographs demonstrate severe degenerative joint disease of the right knee(s). The overall alignment is neutral.There is evidence of loosening of the femoral and tibial components. The bone quality appears to be adequate for age and reported activity level.     Assessment/Plan:  End stage arthritis, right knee(s) with failed previous arthroplasty.   The patient history, physical examination, clinical judgment of the provider and imaging studies are consistent with end stage degenerative joint disease of the right knee(s), previous total knee arthroplasty. Revision total knee arthroplasty is deemed medically necessary. The treatment options including medical management, injection therapy, arthroscopy and revision arthroplasty were discussed at length. The risks and benefits of revision total knee arthroplasty were presented and reviewed. The risks due to aseptic loosening, infection, stiffness, patella tracking  problems, thromboembolic complications and other imponderables were discussed. The patient acknowledged the explanation, agreed to proceed with the plan and consent was signed. Patient is being admitted for inpatient treatment for surgery, pain control, PT, OT, prophylactic antibiotics, VTE prophylaxis, progressive ambulation and ADL's and discharge planning.The patient is planning to be discharged home and will complete outpatient physical therapy.

## 2020-04-07 NOTE — Progress Notes (Signed)
COVID Vaccine Completed:yes Date COVID Vaccine completed:Ist dose only 03/29/20 COVID vaccine manufacturer: Pfizer      PCP - Dr. Franchot Mimes Cardiologist - no  Chest x-ray - no EKG - 04/07/20 Stress Test - no ECHO - no Cardiac Cath - no  Sleep Study - Yes CPAP - no Pt never used the mask and she lost weight and no longer has OSA.  Fasting Blood Sugar - NA Checks Blood Sugar _____ times a day  Blood Thinner Instructions:No Aspirin Instructions: Last Dose:  Anesthesia review:   Patient denies shortness of breath, fever, cough and chest pain at PAT appointment  Yes   Patient verbalized understanding of instructions that were given to them at the PAT appointment. Patient was also instructed that they will need to review over the PAT instructions again at home before surgery. Yes  Pt has a history of CHF but no problems for a while. She has not needed her inhaler for 2 years. Pt is able to climb a few stairs at a time but she has no SOB with ADLs

## 2020-04-10 ENCOUNTER — Other Ambulatory Visit (HOSPITAL_COMMUNITY)
Admission: RE | Admit: 2020-04-10 | Discharge: 2020-04-10 | Disposition: A | Discharge status: Home / Self Care | Payer: Medicare Other | Source: Ambulatory Visit | Attending: Orthopedic Surgery | Admitting: Orthopedic Surgery

## 2020-04-10 DIAGNOSIS — Z20822 Contact with and (suspected) exposure to covid-19: Secondary | ICD-10-CM | POA: Diagnosis not present

## 2020-04-10 DIAGNOSIS — Z01812 Encounter for preprocedural laboratory examination: Secondary | ICD-10-CM | POA: Diagnosis not present

## 2020-04-10 LAB — SARS CORONAVIRUS 2 (TAT 6-24 HRS): SARS Coronavirus 2: NEGATIVE

## 2020-04-13 ENCOUNTER — Encounter (HOSPITAL_COMMUNITY): Payer: Self-pay | Admitting: Orthopedic Surgery

## 2020-04-13 NOTE — Progress Notes (Signed)
Pt was called with surgery time change . She has memory issues so I gave the information to her Daughter.

## 2020-04-14 ENCOUNTER — Inpatient Hospital Stay (HOSPITAL_COMMUNITY): Payer: Medicare Other

## 2020-04-14 ENCOUNTER — Ambulatory Visit (HOSPITAL_COMMUNITY): Payer: Medicare Other | Admitting: Physician Assistant

## 2020-04-14 ENCOUNTER — Inpatient Hospital Stay (HOSPITAL_COMMUNITY)
Admission: RE | Admit: 2020-04-14 | Discharge: 2020-04-16 | DRG: 467 | Disposition: A | Discharge status: Home Health | Payer: Medicare Other | Attending: Orthopedic Surgery | Admitting: Orthopedic Surgery

## 2020-04-14 ENCOUNTER — Ambulatory Visit (HOSPITAL_COMMUNITY): Payer: Medicare Other | Admitting: Anesthesiology

## 2020-04-14 ENCOUNTER — Encounter (HOSPITAL_COMMUNITY)
Admission: RE | Disposition: A | Discharge status: Home Health | Payer: Self-pay | Source: Home / Self Care | Attending: Orthopedic Surgery

## 2020-04-14 ENCOUNTER — Encounter (HOSPITAL_COMMUNITY): Payer: Self-pay | Admitting: Orthopedic Surgery

## 2020-04-14 ENCOUNTER — Other Ambulatory Visit: Payer: Self-pay

## 2020-04-14 DIAGNOSIS — T84032A Mechanical loosening of internal right knee prosthetic joint, initial encounter: Secondary | ICD-10-CM | POA: Diagnosis not present

## 2020-04-14 DIAGNOSIS — Z888 Allergy status to other drugs, medicaments and biological substances status: Secondary | ICD-10-CM | POA: Diagnosis not present

## 2020-04-14 DIAGNOSIS — M1711 Unilateral primary osteoarthritis, right knee: Secondary | ICD-10-CM | POA: Diagnosis present

## 2020-04-14 DIAGNOSIS — Z79899 Other long term (current) drug therapy: Secondary | ICD-10-CM | POA: Diagnosis not present

## 2020-04-14 DIAGNOSIS — T84012A Broken internal right knee prosthesis, initial encounter: Secondary | ICD-10-CM | POA: Diagnosis present

## 2020-04-14 DIAGNOSIS — Y792 Prosthetic and other implants, materials and accessory orthopedic devices associated with adverse incidents: Secondary | ICD-10-CM | POA: Diagnosis present

## 2020-04-14 DIAGNOSIS — Z885 Allergy status to narcotic agent status: Secondary | ICD-10-CM

## 2020-04-14 DIAGNOSIS — Z20822 Contact with and (suspected) exposure to covid-19: Secondary | ICD-10-CM | POA: Diagnosis not present

## 2020-04-14 DIAGNOSIS — I5032 Chronic diastolic (congestive) heart failure: Secondary | ICD-10-CM | POA: Diagnosis present

## 2020-04-14 DIAGNOSIS — Z96652 Presence of left artificial knee joint: Secondary | ICD-10-CM

## 2020-04-14 DIAGNOSIS — Z7951 Long term (current) use of inhaled steroids: Secondary | ICD-10-CM | POA: Diagnosis not present

## 2020-04-14 DIAGNOSIS — K219 Gastro-esophageal reflux disease without esophagitis: Secondary | ICD-10-CM | POA: Diagnosis present

## 2020-04-14 DIAGNOSIS — T84092A Other mechanical complication of internal right knee prosthesis, initial encounter: Principal | ICD-10-CM | POA: Diagnosis present

## 2020-04-14 DIAGNOSIS — G8918 Other acute postprocedural pain: Secondary | ICD-10-CM | POA: Diagnosis not present

## 2020-04-14 DIAGNOSIS — Z7901 Long term (current) use of anticoagulants: Secondary | ICD-10-CM

## 2020-04-14 DIAGNOSIS — K5909 Other constipation: Secondary | ICD-10-CM | POA: Diagnosis present

## 2020-04-14 DIAGNOSIS — F419 Anxiety disorder, unspecified: Secondary | ICD-10-CM | POA: Diagnosis present

## 2020-04-14 DIAGNOSIS — J449 Chronic obstructive pulmonary disease, unspecified: Secondary | ICD-10-CM | POA: Diagnosis present

## 2020-04-14 DIAGNOSIS — Z87891 Personal history of nicotine dependence: Secondary | ICD-10-CM

## 2020-04-14 DIAGNOSIS — D649 Anemia, unspecified: Secondary | ICD-10-CM | POA: Diagnosis present

## 2020-04-14 DIAGNOSIS — M545 Low back pain: Secondary | ICD-10-CM | POA: Diagnosis present

## 2020-04-14 DIAGNOSIS — Z471 Aftercare following joint replacement surgery: Secondary | ICD-10-CM | POA: Diagnosis not present

## 2020-04-14 DIAGNOSIS — I10 Essential (primary) hypertension: Secondary | ICD-10-CM | POA: Diagnosis not present

## 2020-04-14 DIAGNOSIS — I110 Hypertensive heart disease with heart failure: Secondary | ICD-10-CM | POA: Diagnosis present

## 2020-04-14 DIAGNOSIS — G43909 Migraine, unspecified, not intractable, without status migrainosus: Secondary | ICD-10-CM | POA: Diagnosis not present

## 2020-04-14 DIAGNOSIS — Z96651 Presence of right artificial knee joint: Secondary | ICD-10-CM | POA: Diagnosis not present

## 2020-04-14 HISTORY — PX: TOTAL KNEE REVISION: SHX996

## 2020-04-14 LAB — TYPE AND SCREEN
ABO/RH(D): O POS
Antibody Screen: NEGATIVE

## 2020-04-14 LAB — SYNOVIAL CELL COUNT + DIFF, W/ CRYSTALS
Crystals, Fluid: NONE SEEN
WBC, Synovial: 16 /mm3 (ref 0–200)

## 2020-04-14 SURGERY — TOTAL KNEE REVISION
Anesthesia: Spinal | Site: Knee | Laterality: Right

## 2020-04-14 MED ORDER — SODIUM CHLORIDE 0.9 % IV SOLN: INTRAVENOUS | Status: DC

## 2020-04-14 MED ORDER — ACETAMINOPHEN 325 MG PO TABS
325.0000 mg | ORAL_TABLET | Freq: Four times a day (QID) | ORAL | Status: DC | PRN
  Administered 2020-04-16: 650 mg via ORAL
  Filled 2020-04-14: qty 2

## 2020-04-14 MED ORDER — BUPRENORPHINE HCL-NALOXONE HCL 12-3 MG SL FILM: 1.0000 | ORAL_FILM | Freq: Three times a day (TID) | SUBLINGUAL | Status: DC

## 2020-04-14 MED ORDER — DONEPEZIL HCL 10 MG PO TABS
10.0000 mg | ORAL_TABLET | Freq: Every day | ORAL | Status: DC
  Administered 2020-04-14 – 2020-04-16 (×3): 10 mg via ORAL
  Filled 2020-04-14 (×3): qty 1

## 2020-04-14 MED ORDER — CLONAZEPAM 0.5 MG PO TABS
0.5000 mg | ORAL_TABLET | Freq: Two times a day (BID) | ORAL | Status: DC
  Administered 2020-04-14 – 2020-04-16 (×4): 0.5 mg via ORAL
  Filled 2020-04-14 (×4): qty 1

## 2020-04-14 MED ORDER — VANCOMYCIN HCL 1000 MG IV SOLR
INTRAVENOUS | Status: DC | PRN
  Administered 2020-04-14: 1000 mg via TOPICAL

## 2020-04-14 MED ORDER — STERILE WATER FOR IRRIGATION IR SOLN
Status: DC | PRN
  Administered 2020-04-14: 2000 mL

## 2020-04-14 MED ORDER — PROPOFOL 10 MG/ML IV BOLUS
INTRAVENOUS | Status: AC
  Filled 2020-04-14: qty 20

## 2020-04-14 MED ORDER — AMLODIPINE BESY-BENAZEPRIL HCL 5-20 MG PO CAPS: 1.0000 | ORAL_CAPSULE | Freq: Every morning | ORAL | Status: DC

## 2020-04-14 MED ORDER — MOMETASONE FURO-FORMOTEROL FUM 200-5 MCG/ACT IN AERO
2.0000 | INHALATION_SPRAY | Freq: Two times a day (BID) | RESPIRATORY_TRACT | Status: DC
  Administered 2020-04-14 – 2020-04-16 (×4): 2 via RESPIRATORY_TRACT
  Filled 2020-04-14: qty 8.8

## 2020-04-14 MED ORDER — FENTANYL CITRATE (PF) 100 MCG/2ML IJ SOLN
INTRAMUSCULAR | Status: AC
  Filled 2020-04-14: qty 2

## 2020-04-14 MED ORDER — KETOROLAC TROMETHAMINE 30 MG/ML IJ SOLN
INTRAMUSCULAR | Status: AC
  Filled 2020-04-14: qty 1

## 2020-04-14 MED ORDER — HYDROCODONE-ACETAMINOPHEN 7.5-325 MG PO TABS
1.0000 | ORAL_TABLET | ORAL | Status: DC | PRN
  Administered 2020-04-16 (×2): 2 via ORAL
  Filled 2020-04-14 (×2): qty 2

## 2020-04-14 MED ORDER — ONDANSETRON HCL 4 MG PO TABS: 4.0000 mg | ORAL_TABLET | Freq: Four times a day (QID) | ORAL | Status: DC | PRN

## 2020-04-14 MED ORDER — ISOPROPYL ALCOHOL 70 % SOLN
Status: AC
  Filled 2020-04-14: qty 480

## 2020-04-14 MED ORDER — BUPRENORPHINE HCL-NALOXONE HCL 8-2 MG SL SUBL
1.0000 | SUBLINGUAL_TABLET | Freq: Three times a day (TID) | SUBLINGUAL | Status: DC
  Administered 2020-04-14 – 2020-04-16 (×6): 1 via SUBLINGUAL
  Filled 2020-04-14 (×7): qty 1

## 2020-04-14 MED ORDER — VANCOMYCIN HCL IN DEXTROSE 1-5 GM/200ML-% IV SOLN
INTRAVENOUS | Status: AC
  Filled 2020-04-14: qty 200

## 2020-04-14 MED ORDER — CHLORHEXIDINE GLUCONATE 0.12 % MT SOLN
15.0000 mL | Freq: Once | OROMUCOSAL | Status: AC
  Administered 2020-04-14: 15 mL via OROMUCOSAL

## 2020-04-14 MED ORDER — VITAMIN D (ERGOCALCIFEROL) 1.25 MG (50000 UNIT) PO CAPS: 50000.0000 [IU] | ORAL_CAPSULE | ORAL | Status: DC

## 2020-04-14 MED ORDER — CLONIDINE HCL (ANALGESIA) 100 MCG/ML EP SOLN
EPIDURAL | Status: DC | PRN
  Administered 2020-04-14: 50 ug

## 2020-04-14 MED ORDER — ONDANSETRON HCL 4 MG/2ML IJ SOLN
INTRAMUSCULAR | Status: AC
  Filled 2020-04-14: qty 2

## 2020-04-14 MED ORDER — GABAPENTIN 300 MG PO CAPS: 300.0000 mg | ORAL_CAPSULE | ORAL | Status: DC

## 2020-04-14 MED ORDER — PROPOFOL 500 MG/50ML IV EMUL
INTRAVENOUS | Status: DC | PRN
  Administered 2020-04-14: 20 ug/kg/min via INTRAVENOUS

## 2020-04-14 MED ORDER — DIPHENHYDRAMINE HCL 12.5 MG/5ML PO ELIX
12.5000 mg | ORAL_SOLUTION | ORAL | Status: DC | PRN
  Administered 2020-04-15: 25 mg via ORAL
  Filled 2020-04-14: qty 10

## 2020-04-14 MED ORDER — FENTANYL CITRATE (PF) 100 MCG/2ML IJ SOLN
INTRAMUSCULAR | Status: DC | PRN
Start: 2020-04-14 — End: 2020-04-14
  Administered 2020-04-14: 50 ug via INTRAVENOUS
  Administered 2020-04-14 (×4): 25 ug via INTRAVENOUS

## 2020-04-14 MED ORDER — SENNA 8.6 MG PO TABS
1.0000 | ORAL_TABLET | Freq: Two times a day (BID) | ORAL | Status: DC
  Administered 2020-04-14 – 2020-04-16 (×4): 8.6 mg via ORAL
  Filled 2020-04-14 (×4): qty 1

## 2020-04-14 MED ORDER — BENAZEPRIL HCL 20 MG PO TABS
20.0000 mg | ORAL_TABLET | Freq: Every day | ORAL | Status: DC
  Administered 2020-04-15 – 2020-04-16 (×2): 20 mg via ORAL
  Filled 2020-04-14 (×2): qty 1

## 2020-04-14 MED ORDER — FUROSEMIDE 20 MG PO TABS
20.0000 mg | ORAL_TABLET | Freq: Every day | ORAL | Status: DC
  Administered 2020-04-14 – 2020-04-16 (×3): 20 mg via ORAL
  Filled 2020-04-14 (×3): qty 1

## 2020-04-14 MED ORDER — ROPIVACAINE HCL 5 MG/ML IJ SOLN
INTRAMUSCULAR | Status: DC | PRN
  Administered 2020-04-14: 20 mL via PERINEURAL

## 2020-04-14 MED ORDER — TRANEXAMIC ACID-NACL 1000-0.7 MG/100ML-% IV SOLN
1000.0000 mg | INTRAVENOUS | Status: AC
  Administered 2020-04-14: 1000 mg via INTRAVENOUS
  Filled 2020-04-14: qty 100

## 2020-04-14 MED ORDER — FENTANYL CITRATE (PF) 100 MCG/2ML IJ SOLN
INTRAMUSCULAR | Status: AC
Start: 2020-04-14 — End: 2020-04-15
  Filled 2020-04-14: qty 2

## 2020-04-14 MED ORDER — KETOROLAC TROMETHAMINE 15 MG/ML IJ SOLN
7.5000 mg | Freq: Four times a day (QID) | INTRAMUSCULAR | Status: AC
  Administered 2020-04-14 – 2020-04-15 (×4): 7.5 mg via INTRAVENOUS
  Filled 2020-04-14 (×4): qty 1

## 2020-04-14 MED ORDER — PHENYLEPHRINE HCL-NACL 10-0.9 MG/250ML-% IV SOLN
INTRAVENOUS | Status: DC | PRN
  Administered 2020-04-14: 20 ug/min via INTRAVENOUS

## 2020-04-14 MED ORDER — ISOPROPYL ALCOHOL 70 % SOLN
Status: DC | PRN
  Administered 2020-04-14: 1 via TOPICAL

## 2020-04-14 MED ORDER — FOLIC ACID 1 MG PO TABS
1.0000 mg | ORAL_TABLET | Freq: Every day | ORAL | Status: DC
  Administered 2020-04-14 – 2020-04-16 (×3): 1 mg via ORAL
  Filled 2020-04-14 (×3): qty 1

## 2020-04-14 MED ORDER — LIDOCAINE 2% (20 MG/ML) 5 ML SYRINGE
INTRAMUSCULAR | Status: DC | PRN
  Administered 2020-04-14: 20 mg via INTRAVENOUS

## 2020-04-14 MED ORDER — GABAPENTIN 300 MG PO CAPS
600.0000 mg | ORAL_CAPSULE | Freq: Every day | ORAL | Status: DC
  Administered 2020-04-15 – 2020-04-16 (×2): 600 mg via ORAL
  Filled 2020-04-14 (×2): qty 2

## 2020-04-14 MED ORDER — KETOROLAC TROMETHAMINE 30 MG/ML IJ SOLN
INTRAMUSCULAR | Status: DC | PRN
  Administered 2020-04-14: 30 mg

## 2020-04-14 MED ORDER — OXAZEPAM 15 MG PO CAPS: 15.0000 mg | ORAL_CAPSULE | Freq: Every day | ORAL | Status: DC

## 2020-04-14 MED ORDER — PROPOFOL 1000 MG/100ML IV EMUL
INTRAVENOUS | Status: AC
  Filled 2020-04-14: qty 100

## 2020-04-14 MED ORDER — AMLODIPINE BESYLATE 5 MG PO TABS
5.0000 mg | ORAL_TABLET | Freq: Every day | ORAL | Status: DC
  Administered 2020-04-15 – 2020-04-16 (×2): 5 mg via ORAL
  Filled 2020-04-14 (×2): qty 1

## 2020-04-14 MED ORDER — LACTATED RINGERS IV SOLN: INTRAVENOUS | Status: DC

## 2020-04-14 MED ORDER — CEFAZOLIN SODIUM-DEXTROSE 2-4 GM/100ML-% IV SOLN
2.0000 g | INTRAVENOUS | Status: AC
  Administered 2020-04-14: 2 g via INTRAVENOUS
  Filled 2020-04-14: qty 100

## 2020-04-14 MED ORDER — DOCUSATE SODIUM 100 MG PO CAPS
100.0000 mg | ORAL_CAPSULE | Freq: Two times a day (BID) | ORAL | Status: DC
  Administered 2020-04-14 – 2020-04-16 (×4): 100 mg via ORAL
  Filled 2020-04-14 (×4): qty 1

## 2020-04-14 MED ORDER — PROPOFOL 10 MG/ML IV BOLUS
INTRAVENOUS | Status: DC | PRN
  Administered 2020-04-14: 20 mg via INTRAVENOUS

## 2020-04-14 MED ORDER — POLYETHYLENE GLYCOL 3350 17 G PO PACK
17.0000 g | PACK | Freq: Every day | ORAL | Status: DC | PRN
  Administered 2020-04-16: 17 g via ORAL
  Filled 2020-04-14: qty 1

## 2020-04-14 MED ORDER — DEXAMETHASONE SODIUM PHOSPHATE 10 MG/ML IJ SOLN
INTRAMUSCULAR | Status: AC
  Filled 2020-04-14: qty 1

## 2020-04-14 MED ORDER — BUPIVACAINE-EPINEPHRINE 0.25% -1:200000 IJ SOLN
INTRAMUSCULAR | Status: DC | PRN
  Administered 2020-04-14: 30 mL

## 2020-04-14 MED ORDER — POVIDONE-IODINE 10 % EX SWAB
2.0000 "application " | Freq: Once | CUTANEOUS | Status: AC
  Administered 2020-04-14: 2 via TOPICAL

## 2020-04-14 MED ORDER — MENTHOL 3 MG MT LOZG: 1.0000 | LOZENGE | OROMUCOSAL | Status: DC | PRN

## 2020-04-14 MED ORDER — HYDROMORPHONE HCL 1 MG/ML PO LIQD
1.0000 mg | Freq: Once | ORAL | Status: AC
  Administered 2020-04-14: 1 mg via ORAL
  Filled 2020-04-14: qty 1

## 2020-04-14 MED ORDER — METOCLOPRAMIDE HCL 5 MG PO TABS: 5.0000 mg | ORAL_TABLET | Freq: Three times a day (TID) | ORAL | Status: DC | PRN

## 2020-04-14 MED ORDER — MIDAZOLAM HCL 2 MG/2ML IJ SOLN
INTRAMUSCULAR | Status: AC
  Filled 2020-04-14: qty 2

## 2020-04-14 MED ORDER — DEXAMETHASONE SODIUM PHOSPHATE 10 MG/ML IJ SOLN
10.0000 mg | Freq: Once | INTRAMUSCULAR | Status: AC
  Administered 2020-04-15: 10 mg via INTRAVENOUS
  Filled 2020-04-14: qty 1

## 2020-04-14 MED ORDER — PROPOFOL 500 MG/50ML IV EMUL
INTRAVENOUS | Status: AC
  Filled 2020-04-14: qty 50

## 2020-04-14 MED ORDER — HYDROCODONE-ACETAMINOPHEN 5-325 MG PO TABS
1.0000 | ORAL_TABLET | ORAL | Status: DC | PRN
  Administered 2020-04-14 – 2020-04-15 (×5): 2 via ORAL
  Filled 2020-04-14 (×5): qty 2

## 2020-04-14 MED ORDER — MORPHINE SULFATE (PF) 2 MG/ML IV SOLN
0.5000 mg | INTRAVENOUS | Status: DC | PRN
  Administered 2020-04-14: 1 mg via INTRAVENOUS
  Filled 2020-04-14: qty 1

## 2020-04-14 MED ORDER — METOCLOPRAMIDE HCL 5 MG/ML IJ SOLN: 5.0000 mg | Freq: Three times a day (TID) | INTRAMUSCULAR | Status: DC | PRN

## 2020-04-14 MED ORDER — CEFAZOLIN SODIUM-DEXTROSE 2-4 GM/100ML-% IV SOLN
2.0000 g | Freq: Four times a day (QID) | INTRAVENOUS | Status: AC
  Administered 2020-04-14 (×2): 2 g via INTRAVENOUS
  Filled 2020-04-14 (×2): qty 100

## 2020-04-14 MED ORDER — 0.9 % SODIUM CHLORIDE (POUR BTL) OPTIME
TOPICAL | Status: DC | PRN
  Administered 2020-04-14: 1000 mL

## 2020-04-14 MED ORDER — HYDROMORPHONE HCL 1 MG/ML IJ SOLN
1.0000 mg | INTRAMUSCULAR | Status: DC | PRN
  Administered 2020-04-15 (×3): 1 mg via INTRAVENOUS
  Filled 2020-04-14 (×3): qty 1

## 2020-04-14 MED ORDER — POTASSIUM CHLORIDE CRYS ER 20 MEQ PO TBCR
20.0000 meq | EXTENDED_RELEASE_TABLET | Freq: Every day | ORAL | Status: DC
  Administered 2020-04-14 – 2020-04-16 (×3): 20 meq via ORAL
  Filled 2020-04-14 (×3): qty 1

## 2020-04-14 MED ORDER — ORAL CARE MOUTH RINSE: 15.0000 mL | Freq: Once | OROMUCOSAL | Status: AC

## 2020-04-14 MED ORDER — BUPIVACAINE IN DEXTROSE 0.75-8.25 % IT SOLN
INTRATHECAL | Status: DC | PRN
  Administered 2020-04-14: 2 mL via INTRATHECAL

## 2020-04-14 MED ORDER — BUSPIRONE HCL 5 MG PO TABS: 15.0000 mg | ORAL_TABLET | Freq: Three times a day (TID) | ORAL | Status: DC | PRN

## 2020-04-14 MED ORDER — SODIUM CHLORIDE (PF) 0.9 % IJ SOLN
INTRAMUSCULAR | Status: DC | PRN
  Administered 2020-04-14: 30 mL

## 2020-04-14 MED ORDER — ALUM & MAG HYDROXIDE-SIMETH 200-200-20 MG/5ML PO SUSP
30.0000 mL | ORAL | Status: DC | PRN
  Administered 2020-04-15: 30 mL via ORAL
  Filled 2020-04-14: qty 30

## 2020-04-14 MED ORDER — VANCOMYCIN HCL 1000 MG IV SOLR
INTRAVENOUS | Status: AC
  Filled 2020-04-14: qty 1000

## 2020-04-14 MED ORDER — VANCOMYCIN HCL 1000 MG IV SOLR
INTRAVENOUS | Status: DC | PRN
  Administered 2020-04-14: 1000 mg via INTRAVENOUS

## 2020-04-14 MED ORDER — PHENOL 1.4 % MT LIQD: 1.0000 | OROMUCOSAL | Status: DC | PRN

## 2020-04-14 MED ORDER — AMISULPRIDE (ANTIEMETIC) 5 MG/2ML IV SOLN: 10.0000 mg | Freq: Once | INTRAVENOUS | Status: DC | PRN

## 2020-04-14 MED ORDER — POVIDONE-IODINE 10 % EX SWAB: 2.0000 "application " | Freq: Once | CUTANEOUS | Status: DC

## 2020-04-14 MED ORDER — BUPIVACAINE-EPINEPHRINE (PF) 0.25% -1:200000 IJ SOLN
INTRAMUSCULAR | Status: AC
  Filled 2020-04-14: qty 30

## 2020-04-14 MED ORDER — TRAMADOL HCL 50 MG PO TABS
50.0000 mg | ORAL_TABLET | Freq: Four times a day (QID) | ORAL | Status: DC | PRN
  Administered 2020-04-16 (×2): 50 mg via ORAL
  Filled 2020-04-14 (×2): qty 1

## 2020-04-14 MED ORDER — ONDANSETRON HCL 4 MG/2ML IJ SOLN
INTRAMUSCULAR | Status: DC | PRN
  Administered 2020-04-14: 4 mg via INTRAVENOUS

## 2020-04-14 MED ORDER — SODIUM CHLORIDE 0.9 % IR SOLN
Status: DC | PRN
  Administered 2020-04-14: 1000 mL
  Administered 2020-04-14: 3000 mL

## 2020-04-14 MED ORDER — LIDOCAINE 2% (20 MG/ML) 5 ML SYRINGE
INTRAMUSCULAR | Status: AC
  Filled 2020-04-14: qty 5

## 2020-04-14 MED ORDER — GABAPENTIN 300 MG PO CAPS
300.0000 mg | ORAL_CAPSULE | Freq: Every day | ORAL | Status: DC
  Administered 2020-04-14 – 2020-04-15 (×2): 300 mg via ORAL
  Filled 2020-04-14 (×2): qty 1

## 2020-04-14 MED ORDER — SODIUM CHLORIDE (PF) 0.9 % IJ SOLN
INTRAMUSCULAR | Status: AC
  Filled 2020-04-14: qty 50

## 2020-04-14 MED ORDER — DEXAMETHASONE SODIUM PHOSPHATE 10 MG/ML IJ SOLN
INTRAMUSCULAR | Status: DC | PRN
  Administered 2020-04-14: 4 mg via INTRAVENOUS

## 2020-04-14 MED ORDER — FENTANYL CITRATE (PF) 100 MCG/2ML IJ SOLN
25.0000 ug | INTRAMUSCULAR | Status: DC | PRN
  Administered 2020-04-14: 25 ug via INTRAVENOUS
  Administered 2020-04-14: 50 ug via INTRAVENOUS

## 2020-04-14 MED ORDER — CLONAZEPAM 0.5 MG PO TABS: 0.5000 mg | ORAL_TABLET | Freq: Every day | ORAL | Status: DC

## 2020-04-14 MED ORDER — ASPIRIN 81 MG PO CHEW
81.0000 mg | CHEWABLE_TABLET | Freq: Two times a day (BID) | ORAL | Status: DC
  Administered 2020-04-14 – 2020-04-15 (×3): 81 mg via ORAL
  Filled 2020-04-14 (×3): qty 1

## 2020-04-14 MED ORDER — ONDANSETRON HCL 4 MG/2ML IJ SOLN: 4.0000 mg | Freq: Four times a day (QID) | INTRAMUSCULAR | Status: DC | PRN

## 2020-04-14 MED ORDER — ACETAMINOPHEN 10 MG/ML IV SOLN
1000.0000 mg | Freq: Once | INTRAVENOUS | Status: AC
  Administered 2020-04-14: 1000 mg via INTRAVENOUS
  Filled 2020-04-14: qty 100

## 2020-04-14 SURGICAL SUPPLY — 94 items
ADAPTER OFFSET W/SCREWS 5 (Joint) ×2 IMPLANT
ADAPTER OFFSET W/SCREWS 5MM (Joint) ×1 IMPLANT
AUG BLOCK FEM VGD 70X10 RT (Joint) ×3 IMPLANT
AUG BMT 360 TIB SM CRUC WING (Joint) ×3 IMPLANT
AUG FEM DISTAL 5X70 (Joint) ×2 IMPLANT
AUG FEMORAL DISTAL 5X70MM (Joint) ×1 IMPLANT
AUGMENT BLOCK FEM VGD 70X10 RT (Joint) ×1 IMPLANT
AUGMENT BMT 360 TIB SM CRC WNG (Joint) ×1 IMPLANT
AUGMENT FEM DISTAL 5X70 (Joint) ×1 IMPLANT
BAG DECANTER FOR FLEXI CONT (MISCELLANEOUS) ×6 IMPLANT
BAG ZIPLOCK 12X15 (MISCELLANEOUS) ×3 IMPLANT
BEARING TIBIA VANG 12X71/75 (Orthopedic Implant) ×1 IMPLANT
BIT DRILL QUICK REL 1/8 2PK SL (DRILL) ×2 IMPLANT
BLADE SAW RECIPROCATING 77.5 (BLADE) ×3 IMPLANT
BLADE SAW SGTL 81X20 HD (BLADE) ×3 IMPLANT
BLADE SURG SZ10 CARB STEEL (BLADE) ×3 IMPLANT
BLOCK TIBIA VANGUARD 75X5 (Knees) ×4 IMPLANT
BLOCK TIBIA VANGUARD 75X5MM (Knees) ×2 IMPLANT
BNDG ELASTIC 6X10 VLCR STRL LF (GAUZE/BANDAGES/DRESSINGS) ×3 IMPLANT
BNDG ELASTIC 6X5.8 VLCR STR LF (GAUZE/BANDAGES/DRESSINGS) ×3 IMPLANT
CANISTER PREVENA PLUS 150 (CANNISTER) ×3 IMPLANT
CEMENT BONE REFOBACIN R1X40 US (Cement) ×15 IMPLANT
CHLORAPREP W/TINT 26 (MISCELLANEOUS) ×6 IMPLANT
COMP FEM VANGUARD 72.5 LT (Knees) ×3 IMPLANT
COMPONENT FEM VANGUARD 72.5 LT (Knees) ×1 IMPLANT
COVER SURGICAL LIGHT HANDLE (MISCELLANEOUS) ×3 IMPLANT
COVER WAND RF STERILE (DRAPES) IMPLANT
CUFF TOURN SGL QUICK 34 (TOURNIQUET CUFF) ×3
CUFF TRNQT CYL 34X4.125X (TOURNIQUET CUFF) ×1 IMPLANT
DECANTER SPIKE VIAL GLASS SM (MISCELLANEOUS) IMPLANT
DERMABOND ADVANCED (GAUZE/BANDAGES/DRESSINGS) ×2
DERMABOND ADVANCED .7 DNX12 (GAUZE/BANDAGES/DRESSINGS) ×1 IMPLANT
DRAPE SHEET LG 3/4 BI-LAMINATE (DRAPES) ×9 IMPLANT
DRAPE U-SHAPE 47X51 STRL (DRAPES) ×3 IMPLANT
DRESSING PREVENA PLUS CUSTOM (GAUZE/BANDAGES/DRESSINGS) ×1 IMPLANT
DRILL QUICK RELEASE 1/8 INCH (DRILL) ×6
DRSG AQUACEL AG ADV 3.5X10 (GAUZE/BANDAGES/DRESSINGS) ×3 IMPLANT
DRSG PREVENA PLUS CUSTOM (GAUZE/BANDAGES/DRESSINGS) ×3
DRSG TEGADERM 4X4.75 (GAUZE/BANDAGES/DRESSINGS) ×3 IMPLANT
DRSG VAC ATS LRG SENSATRAC (GAUZE/BANDAGES/DRESSINGS) ×3 IMPLANT
ELECT BLADE TIP CTD 4 INCH (ELECTRODE) ×3 IMPLANT
ELECT REM PT RETURN 15FT ADLT (MISCELLANEOUS) ×3 IMPLANT
EVACUATOR 1/8 PVC DRAIN (DRAIN) ×3 IMPLANT
GLOVE BIO SURGEON STRL SZ8.5 (GLOVE) ×6 IMPLANT
GLOVE BIOGEL M STRL SZ7.5 (GLOVE) ×6 IMPLANT
GLOVE BIOGEL PI IND STRL 8 (GLOVE) ×1 IMPLANT
GLOVE BIOGEL PI IND STRL 8.5 (GLOVE) ×1 IMPLANT
GLOVE BIOGEL PI INDICATOR 8 (GLOVE) ×2
GLOVE BIOGEL PI INDICATOR 8.5 (GLOVE) ×2
GOWN SPEC L3 XXLG W/TWL (GOWN DISPOSABLE) ×3 IMPLANT
GOWN STRL REUS W/TWL XL LVL3 (GOWN DISPOSABLE) ×3 IMPLANT
HANDPIECE INTERPULSE COAX TIP (DISPOSABLE) ×3
HOLDER FOLEY CATH W/STRAP (MISCELLANEOUS) IMPLANT
HOOD PEEL AWAY FLYTE STAYCOOL (MISCELLANEOUS) ×12 IMPLANT
JET LAVAGE IRRISEPT WOUND (IRRIGATION / IRRIGATOR) ×3
KIT TURNOVER KIT A (KITS) IMPLANT
LAVAGE JET IRRISEPT WOUND (IRRIGATION / IRRIGATOR) ×1 IMPLANT
MANIFOLD NEPTUNE II (INSTRUMENTS) ×3 IMPLANT
MARKER SKIN DUAL TIP RULER LAB (MISCELLANEOUS) ×3 IMPLANT
NEEDLE SPNL 18GX3.5 QUINCKE PK (NEEDLE) ×3 IMPLANT
NS IRRIG 1000ML POUR BTL (IV SOLUTION) ×3 IMPLANT
OSTEOTOME THIN 10.0 3 (INSTRUMENTS) ×3 IMPLANT
PACK TOTAL KNEE CUSTOM (KITS) ×3 IMPLANT
PADDING CAST COTTON 6X4 STRL (CAST SUPPLIES) IMPLANT
PADDING CAST SYN 6 (CAST SUPPLIES) ×2
PADDING CAST SYNTHETIC 6X4 NS (CAST SUPPLIES) ×1 IMPLANT
PENCIL SMOKE EVACUATOR (MISCELLANEOUS) IMPLANT
PROTECTOR NERVE ULNAR (MISCELLANEOUS) ×3 IMPLANT
SAW OSC TIP CART 19.5X105X1.3 (SAW) ×3 IMPLANT
SEALER BIPOLAR AQUA 6.0 (INSTRUMENTS) ×3 IMPLANT
SET HNDPC FAN SPRY TIP SCT (DISPOSABLE) ×1 IMPLANT
SET PAD KNEE POSITIONER (MISCELLANEOUS) ×3 IMPLANT
SPONGE DRAIN TRACH 4X4 STRL 2S (GAUZE/BANDAGES/DRESSINGS) ×3 IMPLANT
SPONGE LAP 18X18 RF (DISPOSABLE) IMPLANT
STEM FEM VG 22X120 (Stem) ×3 IMPLANT
STEM FEMORAL VANGUARD 20X80MM (Stem) ×3 IMPLANT
SUT ETHILON 2 0 PSLX (SUTURE) ×9 IMPLANT
SUT MNCRL AB 3-0 PS2 18 (SUTURE) ×3 IMPLANT
SUT MON AB 2-0 CT1 36 (SUTURE) ×6 IMPLANT
SUT STRATAFIX PDO 1 14 VIOLET (SUTURE) ×3
SUT STRATFX PDO 1 14 VIOLET (SUTURE) ×1
SUT VIC AB 1 CTX 36 (SUTURE) ×6
SUT VIC AB 1 CTX36XBRD ANBCTR (SUTURE) ×2 IMPLANT
SUT VIC AB 2-0 CT1 27 (SUTURE) ×3
SUT VIC AB 2-0 CT1 TAPERPNT 27 (SUTURE) ×1 IMPLANT
SUTURE STRATFX PDO 1 14 VIOLET (SUTURE) ×1 IMPLANT
TIBIA BEARING VANG 12X71/75 (Orthopedic Implant) ×3 IMPLANT
TOWER CARTRIDGE SMART MIX (DISPOSABLE) ×6 IMPLANT
TRAY FOLEY MTR SLVR 16FR STAT (SET/KITS/TRAYS/PACK) ×3 IMPLANT
TRAY TIBIAL VANGUARD 75MM (Knees) ×3 IMPLANT
TUBE SUCTION HIGH CAP CLEAR NV (SUCTIONS) ×3 IMPLANT
WATER STERILE IRR 1000ML POUR (IV SOLUTION) ×3 IMPLANT
WRAP KNEE MAXI GEL POST OP (GAUZE/BANDAGES/DRESSINGS) ×3 IMPLANT
YANKAUER SUCT BULB TIP 10FT TU (MISCELLANEOUS) ×3 IMPLANT

## 2020-04-14 NOTE — Anesthesia Procedure Notes (Signed)
Anesthesia Regional Block: Adductor canal block   Pre-Anesthetic Checklist: ,, timeout performed, Correct Patient, Correct Site, Correct Laterality, Correct Procedure, Correct Position, site marked, Risks and benefits discussed,  Surgical consent,  Pre-op evaluation,  At surgeon's request and post-op pain management  Laterality: Right  Prep: chloraprep       Needles:  Injection technique: Single-shot  Needle Type: Echogenic Needle     Needle Length: 9cm  Needle Gauge: 21     Additional Needles:   Procedures:,,,, ultrasound used (permanent image in chart),,,,  Narrative:  Start time: 04/14/2020 7:52 AM End time: 04/14/2020 7:58 AM Injection made incrementally with aspirations every 5 mL.  Performed by: Personally  Anesthesiologist: Marcene Duos, MD

## 2020-04-14 NOTE — Op Note (Signed)
OPERATIVE REPORT   04/14/2020  12:26 PM  PATIENT:  Jessica Lopez   SURGEON:  Bertram Savin, MD  ASSISTANT:  Nehemiah Massed, PA-C  PREOPERATIVE DIAGNOSIS:  Failed right total knee arthroplasty.  POSTOPERATIVE DIAGNOSIS:  Same.  PROCEDURE: Revision right total knee arthroplasty, femoral and tibial components  ANESTHESIA:   Regional. MAC.  ANTIBIOTICS:  2 g Ancef. 1 g Vancomycin.  EXPLANTS: DePuy Sigma RP PS knee system with size 4 femur, size 4 tibia, and PS RP bearing.  IMPLANTS: Zimmer Biomet 360 revision femoral component size 72.5 mm with 10 mm distal medial augment and 5 mm distal lateral augment, 22 x 120 mm splined stem. Zimmer Biomet 360 revision tibial component size 75 mm with 5 mm distal and lateral augment, 5 mm offset adapter, small cruciate wing, and 20 x 80 mm splined stem. 12 mm ArCom constrained PS tibial bearing. Refobacin R bone cement.  SPECIMENS: Right knee synovial fluid for cell count with differential. Femoral interface membrane for tissue culture x1.  COMPLICATIONS: None.  DISPOSITION: Stable to PACU.  SURGICAL INDICATIONS:  Jessica Lopez is a 79 y.o. female with a diagnosis of Failed right total knee arthroplasty. She had disabling pain that was affecting her activities of daily living. Preoperative work-up was negative for infection. Bone scan showed loosening of femoral and tibial components. She had severe preoperative stiffness. She was indicated for revision right total knee arthroplasty.  The risks, benefits, and alternatives were discussed with the patient. There are risks associated with the surgery including, but not limited to, problems with anesthesia (death), infection, instability (giving out of the joint), dislocation, differences in leg length/angulation/rotation, fracture of bones, loosening or failure of implants, hematoma (blood accumulation) which may require surgical drainage, blood clots, pulmonary embolism, nerve injury  (foot drop), and blood vessel injury. The patient understands these risks and elects to proceed.  PROCEDURE IN DETAIL: The patient was identified in the holding area using 2 identifiers. The surgical site was marked by myself. Anesthesia team placed an abductor canal block. She was taken to the operating room, and spinal anesthesia was obtained. She was positioned supine on the operating room table. All bony prominences were well-padded. Nonsterile tourniquet was applied to the right upper thigh. The right lower extremity was prepped and draped in the normal sterile surgical fashion. Timeout was called, verifying site and site of surgery. She did receive IV antibiotics within 60 minutes of beginning the procedure.  Esmarch exsanguination was used, and the tourniquet was elevated to 325 mmHg. Using a #10 blade, I sharply excised her previous anterior knee scar. Full-thickness skin flaps were created. Through the intact extensor mechanism, I aspirated about 2 cc of clear, straw-colored joint fluid. I sent the joint fluid to the lab for routine cell count with differential. A standard medial parapatellar arthrotomy was created. There was significant synovial proliferation. No evidence of infection. A medial release was performed.  The knee was brought into extension. Radical synovectomy was performed of the medial gutter, lateral gutter, and suprapatellar pouch. The knee was then flexed. The femoral component was clearly debonded from the cement. There was significant oxidation and fragmentation of the polybearing surface. An osteotome was used to amputate the tibial peg. The polyliner was then removed. The tibial component was also debonded from the cement. The femoral and tibial components were easily removed by hand. Cement was removed very carefully and meticulously with an osteotome. There is very significant osteolysis to the distal femur involving the cancellous  bone of the medial femoral condyle and  lateral femoral condyle. The cortical rim was intact. A sample of the femoral interface membrane was sent for routine tissue culture. On the tibial side, there was a large contained defect of the cancellous bone of the lateral proximal tibia. Entry drill was used to gain access to the intramedullary canal of the distal femur and proximal tibia. Back scratcher's were used to clean old cement debris.  I then sequentially reamed up to a size 20 mm reamer on the tibial side. Intramedullary technique was used to fraction the proximal tibial cut. The tibia was sized to a 72.5 mm component. 5 mm coin was used to obtain the correct rotation and placement. Proximal tibia was then pinned and punched for a small cruciate wing. The trial tibial component was assembled on the back table and impacted into the tibia.  I sequentially reamed up to a size 22 mm reamer on the femur. Skim distal cut was made. Femoral component was sized to a size 75 mm component. I planned for a 5 mm augment distal lateral, and a 10 mm augment distal medial. Box cut was made. The femoral component was assembled on the back table and impacted into place. I trialed with a 12 mm polytrial. The flexion and extension gaps were well-balanced. There was good varus/valgus stability throughout the range of motion. The tourniquet was then let down. Trial implants were removed. The knee was brought into extension again, and I inspected the patellar component. Excess scar tissue was cleared. The patellar component was well fixed without any significant wear. Therefore, I elected to retain the patellar component.  The real components were opened and assembled on the back table. After the appropriate amount of time had elapsed, the tourniquet was reinflated. The cut bony surfaces were irrigated with pulse lavage and dried with a lap sponge. The femoral and then tibial components were cemented with hybrid fixation technique. Trial polywas placed and the knee  was brought into extension while the cement polymerized. Excess cement was cleared. The real polytrial was placed and secured with the Biomet locking clip. Stability testing was repeated. There was excellent varus and valgus stability throughout range of motion. Flexion and extension gaps were perfectly balanced. The patella tracked centrally using a no thumbs technique. The tourniquet was let down. Meticulous hemostasis was achieved with the aqua mantis and Bovie electrocautery.  The knee was copiously irrigated with irrisept solution followed by normal saline with pulse lavage. Vancomycin powder was placed within the joint. The arthrotomy was closed with #1 Vicryl and #1 strata fix suture. Deep dermal layer was closed with 2-0 Monocryl. Skin was closed with 2-0 nylon vertical mattress sutures. Customizable Prevena incisional dressing was placed according to manufacturer's instructions. The VAC was hooked up to 125 mmHg with excellent seal. There were no leaks. Bulky dressing was placed followed by an Ace wrap.  Patient was then awakened from anesthesia and taken to the PACU in stable condition. Sponge, needle, and instrument counts were correct at the end of the case x2. There were no known complications.  POSTOPERATIVE PLAN: Postoperatively, the patient will be admitted to the orthopedic floor. Weightbearing as tolerated right lower extremity with a walker. Begin aspirin for DVT prophylaxis. Routine antibiotic prophylaxis. Follow intraoperative culture. Mobilize out of bed with physical therapy. Plan for discharge home when medically ready. At the time of discharge, the house VAC suction unit will be exchanged for portable Prevena suction unit. Patient will need to return  to the office within 7 days of discharge for removal of the negative pressure incisional dressing.

## 2020-04-14 NOTE — Plan of Care (Signed)
  Problem: Education: Goal: Knowledge of General Education information will improve Description Including pain rating scale, medication(s)/side effects and non-pharmacologic comfort measures Outcome: Progressing   

## 2020-04-14 NOTE — Anesthesia Preprocedure Evaluation (Signed)
Anesthesia Evaluation  Patient identified by MRN, date of birth, ID band Patient awake    Reviewed: Allergy & Precautions, NPO status , Patient's Chart, lab work & pertinent test results  Airway Mallampati: II  TM Distance: >3 FB Neck ROM: Full    Dental  (+) Dental Advisory Given   Pulmonary COPD, former smoker,    breath sounds clear to auscultation       Cardiovascular hypertension, Pt. on medications and Pt. on home beta blockers  Rhythm:Regular Rate:Normal     Neuro/Psych negative neurological ROS     GI/Hepatic Neg liver ROS, GERD  ,  Endo/Other  negative endocrine ROS  Renal/GU negative Renal ROS     Musculoskeletal  (+) Arthritis ,   Abdominal   Peds  Hematology negative hematology ROS (+)   Anesthesia Other Findings   Reproductive/Obstetrics                             Lab Results  Component Value Date   WBC 8.8 04/07/2020   HGB 13.9 04/07/2020   HCT 44.0 04/07/2020   MCV 91.7 04/07/2020   PLT 294 04/07/2020   Lab Results  Component Value Date   CREATININE 0.89 04/07/2020   BUN 13 04/07/2020   NA 139 04/07/2020   K 4.2 04/07/2020   CL 103 04/07/2020   CO2 26 04/07/2020    Anesthesia Physical Anesthesia Plan  ASA: II  Anesthesia Plan: Spinal   Post-op Pain Management:  Regional for Post-op pain   Induction:   PONV Risk Score and Plan: 2 and Propofol infusion, Ondansetron and Treatment may vary due to age or medical condition  Airway Management Planned: Natural Airway and Simple Face Mask  Additional Equipment:   Intra-op Plan:   Post-operative Plan:   Informed Consent: I have reviewed the patients History and Physical, chart, labs and discussed the procedure including the risks, benefits and alternatives for the proposed anesthesia with the patient or authorized representative who has indicated his/her understanding and acceptance.       Plan  Discussed with: CRNA  Anesthesia Plan Comments:         Anesthesia Quick Evaluation

## 2020-04-14 NOTE — Interval H&P Note (Signed)
History and Physical Interval Note:  04/14/2020 8:02 AM  Jessica Lopez  has presented today for surgery, with the diagnosis of Failed right total knee arthroplasty.  The various methods of treatment have been discussed with the patient and family. After consideration of risks, benefits and other options for treatment, the patient has consented to  Procedure(s): TOTAL KNEE REVISION (Right) as a surgical intervention.  The patient's history has been reviewed, patient examined, no change in status, stable for surgery.  I have reviewed the patient's chart and labs.  Questions were answered to the patient's satisfaction.     Iline Oven Willow Reczek

## 2020-04-14 NOTE — Transfer of Care (Signed)
Immediate Anesthesia Transfer of Care Note  Patient: Jessica Lopez  Procedure(s) Performed: TOTAL KNEE REVISION (Right Knee)  Patient Location: PACU  Anesthesia Type:Spinal  Level of Consciousness: awake, alert , oriented and patient cooperative  Airway & Oxygen Therapy: Patient Spontanous Breathing and Patient connected to face mask oxygen  Post-op Assessment: Report given to RN and Post -op Vital signs reviewed and stable  Post vital signs: Reviewed and stable  Last Vitals:  Vitals Value Taken Time  BP 155/68 04/14/20 1316  Temp    Pulse 76 04/14/20 1318  Resp 15 04/14/20 1318  SpO2 99 % 04/14/20 1318  Vitals shown include unvalidated device data.  Last Pain:  Vitals:   04/14/20 0703  TempSrc: Oral         Complications: No complications documented.

## 2020-04-14 NOTE — Evaluation (Signed)
Physical Therapy Evaluation Patient Details Name: Jessica Lopez MRN: [id number] DOB: October 14, 1940 Today's Date: 04/14/2020   History of Present Illness  s/p R TKA revision. PMH: L TKA, HTN, anxiety  Clinical Impression  Pt is s/p TKA resulting in the deficits listed below (see PT Problem List).  Pt agreeable to OOB, mobility limited by pain. Nursing notified. Will continue to follow in acute setting  Pt will benefit from skilled PT to increase their independence and safety with mobility to allow discharge to the venue listed below.      Follow Up Recommendations Follow surgeon's recommendation for DC plan and follow-up therapies    Equipment Recommendations  Rolling walker with 5" wheels;3in1 (PT)    Recommendations for Other Services       Precautions / Restrictions Precautions Precautions: Fall;Knee Restrictions Weight Bearing Restrictions: No Other Position/Activity Restrictions: WBAT      Mobility  Bed Mobility Overal bed mobility: Needs Assistance Bed Mobility: Supine to Sit     Supine to sit: Min assist;HOB elevated     General bed mobility comments: incr time, assist wtih RLE  Transfers Overall transfer level: Needs assistance Equipment used: Rolling walker (2 wheeled) Transfers: Sit to/from [employer] Sit to Stand: Min assist Stand pivot transfers: Min assist       General transfer comment: assist to rise and transition to RW, incr time, cues for safety and hand placement. assist to steady and maneuver RW for SPT, placing very little wt on RLE d/t pain  Ambulation/Gait                Stairs            Wheelchair Mobility    Modified Rankin (Stroke Patients Only)       Balance Overall balance assessment: Needs assistance   Sitting balance-Leahy Scale: Fair       Standing balance-Leahy Scale: Poor Standing balance comment: reliant on UEs                             Pertinent Vitals/Pain Pain  Assessment: 0-10 Pain Score: 8  Pain Location: right knee Pain Descriptors / Indicators: Sore;Grimacing;Operative site guarding Pain Intervention(s): Limited activity within patient's tolerance;Monitored during session;Repositioned;Patient requesting pain meds-RN notified    Home Living Family/patient expects to be discharged to:: Private residence Living Arrangements: Children;Other (Comment) (grandson) Available Help at Discharge: Family Type of Home: House Home Access: Stairs to enter Entrance Stairs-Rails: None Secretary/administrator of Steps: 1 Home Layout: One level Home Equipment: None;Crutches Additional Comments: has ?maybe a standard walker that she purchased at [facility]    Prior Function Level of Independence: Independent with assistive device(s)         Comments: used crutches some prior to admission     Hand Dominance        Extremity/Trunk Assessment   Upper Extremity Assessment Upper Extremity Assessment: Generalized weakness    Lower Extremity Assessment Lower Extremity Assessment: RLE deficits/detail RLE Deficits / Details: ankle, knee, hip ROM limtied by pain. strength grossly 2/5, limited by pain RLE: Unable to fully assess due to pain       Communication   Communication: No difficulties  Cognition Arousal/Alertness: Awake/alert Behavior During Therapy: WFL for tasks assessed/performed Overall Cognitive Status: Within Functional Limits for tasks assessed  General Comments      Exercises Total Joint Exercises Ankle Circles/Pumps: AROM;Both;5 reps;Limitations Ankle Circles/Pumps Limitations: pain   Assessment/Plan    PT Assessment Patient needs continued PT services  PT Problem List Decreased strength;Decreased activity tolerance;Decreased mobility;Decreased knowledge of use of DME;Pain       PT Treatment Interventions DME instruction;Therapeutic exercise;Gait training;Functional  mobility training;Therapeutic activities;Patient/family education;Stair training    PT Goals (Current goals can be found in the Care Plan section)  Acute Rehab PT Goals Patient Stated Goal: home soon PT Goal Formulation: With patient Time For Goal Achievement: 04/21/20 Potential to Achieve Goals: Good    Frequency 7X/week   Barriers to discharge        Co-evaluation               AM-PAC PT "6 Clicks" Mobility  Outcome Measure Help needed turning from your back to your side while in a flat bed without using bedrails?: A Little Help needed moving from lying on your back to sitting on the side of a flat bed without using bedrails?: A Little Help needed moving to and from a bed to a chair (including a wheelchair)?: A Little Help needed standing up from a chair using your arms (e.g., wheelchair or bedside chair)?: A Little Help needed to walk in hospital room?: A Lot Help needed climbing 3-5 steps with a railing? : Total 6 Click Score: 15    End of Session Equipment Utilized During Treatment: Gait belt Activity Tolerance: Patient tolerated treatment well Patient left: in chair;with call bell/phone within reach;with chair alarm set (dtr stepped out but coming back) Nurse Communication: Mobility status PT Visit Diagnosis: Difficulty in walking, not elsewhere classified (R26.2)    Time: 5631-4970 PT Time Calculation (min) (ACUTE ONLY): 29 min   Charges:   PT Evaluation $PT Eval Low Complexity: 1 Low PT Treatments $Therapeutic Activity: 8-22 mins        Delice Bison, PT  Acute Rehab Dept ([facility]) [phone] Pager [phone]  04/14/2020   [facility] 04/14/2020, 5:24 PM

## 2020-04-14 NOTE — Anesthesia Procedure Notes (Addendum)
Spinal  Patient location during procedure: OR Start time: 04/14/2020 8:43 AM End time: 04/14/2020 8:50 AM Staffing Performed: anesthesiologist  Anesthesiologist: Marcene Duos, MD Preanesthetic Checklist Completed: patient identified, IV checked, site marked, risks and benefits discussed, surgical consent, monitors and equipment checked, pre-op evaluation and timeout performed Spinal Block Patient position: sitting Prep: DuraPrep Patient monitoring: heart rate, cardiac monitor, continuous pulse ox and blood pressure Approach: right paramedian Location: L4-5 Injection technique: single-shot Needle Needle type: Quincke  Needle gauge: 22 G Needle length: 9 cm Assessment Sensory level: T4

## 2020-04-14 NOTE — Anesthesia Procedure Notes (Signed)
Procedure Name: MAC Date/Time: 04/14/2020 8:40 AM Performed by: Eben Burow, CRNA Pre-anesthesia Checklist: Patient identified, Emergency Drugs available, Suction available, Patient being monitored and Timeout performed Oxygen Delivery Method: Simple face mask Placement Confirmation: positive ETCO2

## 2020-04-15 LAB — BASIC METABOLIC PANEL
Anion gap: 10 (ref 5–15)
BUN: 11 mg/dL (ref 8–23)
CO2: 22 mmol/L (ref 22–32)
Calcium: 8.4 mg/dL — ABNORMAL LOW (ref 8.9–10.3)
Chloride: 107 mmol/L (ref 98–111)
Creatinine, Ser: 0.89 mg/dL (ref 0.44–1.00)
GFR calc Af Amer: >60 mL/min (ref >60)
GFR calc non Af Amer: >60 mL/min (ref >60)
Glucose, Bld: 113 mg/dL — ABNORMAL HIGH (ref 70–99)
Potassium: 4.3 mmol/L (ref 3.5–5.1)
Sodium: 139 mmol/L (ref 135–145)

## 2020-04-15 LAB — CBC
HCT: 31.4 % — ABNORMAL LOW (ref 36.0–46.0)
Hemoglobin: 9.9 g/dL — ABNORMAL LOW (ref 12.0–15.0)
MCH: 28.9 pg (ref 26.0–34.0)
MCHC: 31.5 g/dL (ref 30.0–36.0)
MCV: 91.8 fL (ref 80.0–100.0)
Platelets: 188 10*3/uL (ref 150–400)
RBC: 3.42 MIL/uL — ABNORMAL LOW (ref 3.87–5.11)
RDW: 12.7 % (ref 11.5–15.5)
WBC: 13.7 10*3/uL — ABNORMAL HIGH (ref 4.0–10.5)
nRBC: 0 % (ref 0.0–0.2)

## 2020-04-15 MED ORDER — HYDROCODONE-ACETAMINOPHEN 5-325 MG PO TABS: 1.0000 | ORAL_TABLET | ORAL | 0 refills | Status: DC | PRN

## 2020-04-15 MED ORDER — SENNA 8.6 MG PO TABS: 2.0000 | ORAL_TABLET | Freq: Every day | ORAL | 1 refills | Status: AC

## 2020-04-15 MED ORDER — DOCUSATE SODIUM 100 MG PO CAPS: 100.0000 mg | ORAL_CAPSULE | Freq: Two times a day (BID) | ORAL | 1 refills | Status: AC

## 2020-04-15 MED ORDER — OXAZEPAM 15 MG PO CAPS: 15.0000 mg | ORAL_CAPSULE | Freq: Every evening | ORAL | Status: DC | PRN

## 2020-04-15 MED ORDER — ASPIRIN 81 MG PO CHEW: 81.0000 mg | CHEWABLE_TABLET | Freq: Two times a day (BID) | ORAL | 0 refills | Status: DC

## 2020-04-15 MED ORDER — LORAZEPAM 1 MG PO TABS: 1.0000 mg | ORAL_TABLET | Freq: Every evening | ORAL | Status: DC | PRN

## 2020-04-15 MED ORDER — ONDANSETRON HCL 4 MG PO TABS: 4.0000 mg | ORAL_TABLET | Freq: Four times a day (QID) | ORAL | 0 refills | Status: AC | PRN

## 2020-04-15 NOTE — TOC Progression Note (Signed)
Transition of Care [facility]) - Progression Note    Patient Details  Name: Jessica Lopez MRN: [id number] Date of Birth: 02/29/1941  [facility]) CM/SW Contact  Lennart Pall, LCSW Phone Number: 04/15/2020, 9:49 AM  Clinical Narrative:    Met with pt and daughter this morning to review dc needs.  DME ordered via [employer] (see below).  Discussed follow up therapy and pt prefers to begin with HHPT if possible.  Await recommendations from tx team.     Barriers to Discharge: Continued Medical Work up  Expected Discharge Plan and Services                           DME Arranged: 3-N-1, Walker rolling DME Agency: [employer] Date DME Agency Contacted: 04/15/20 Time DME Agency Contacted: 0930 Representative spoke with at DME Agency: [place] Interventions    Readmission Risk Interventions Readmission Risk Prevention Plan 04/15/2020  Post Dischage Appt Complete  Medication Screening Complete  Transportation Screening Complete  Some recent data might be hidden

## 2020-04-15 NOTE — Progress Notes (Signed)
Physical Therapy Treatment Patient Details Name: Jessica Lopez MRN: [id number] DOB: [birth year] Today's Date: 04/15/2020    History of Present Illness Patient is a 79 year old female s/p R TKA revision. PMH: L TKA in 2004 per DTR, HTN, anxiety    PT Comments    Pt very cooperative and with noted improvement in activity tolerance but continues with ltd endurance and pain limited.   Follow Up Recommendations  Follow surgeons recommendation for DC plan and follow-up therapies     Equipment Recommendations  Rolling walker with 5" wheels;3in1 (PT)    Recommendations for Other Services       Precautions / Restrictions Precautions Precautions: Fall;Knee Restrictions Weight Bearing Restrictions: No Other Position/Activity Restrictions: WBAT    Mobility  Bed Mobility Overal bed mobility: Needs Assistance Bed Mobility: Sit to Supine     Supine to sit: Min guard;HOB elevated Sit to supine: Min assist   General bed mobility comments: increased time and use of bed rail  Transfers Overall transfer level: Needs assistance Equipment used: Rolling walker (2 wheeled) Transfers: Sit to/from Stand Sit to Stand: Mod assist Stand pivot transfers: Min assist       General transfer comment: mod A to power up to standing with cues for hand placement,   Ambulation/Gait Ambulation/Gait assistance: Min assist Gait Distance (Feet): 19 Feet Assistive device: Rolling walker (2 wheeled) Gait Pattern/deviations: Step-to pattern;Decreased step length - right;Decreased step length - left;Shuffle;Trunk flexed Gait velocity: decr   General Gait Details: Increased time with cues for sequence, posture and position from [employer]             Wheelchair Mobility    Modified Rankin (Stroke Patients Only)       Balance Overall balance assessment: Needs assistance Sitting-balance support: Feet supported Sitting balance-Leahy Scale: Fair     Standing balance support:  Bilateral upper extremity supported Standing balance-Leahy Scale: Poor Standing balance comment: reliant on UEs                            Cognition Arousal/Alertness: Awake/alert Behavior During Therapy: WFL for tasks assessed/performed Overall Cognitive Status: Impaired/Different from baseline Area of Impairment: Memory;Problem solving;Safety/judgement                 Orientation Level: Disoriented to;Place;Time   Memory: Decreased short-term memory Following Commands: Follows one step commands consistently Safety/Judgement: Decreased awareness of safety   Problem Solving: Requires verbal cues;Requires tactile cues General Comments: asked DTR if patient is normally oriented to month/year DTR states "If someone tells her she will remember" patient can't remember taking her "memory pills" while OT in the room       Exercises Total Joint Exercises Ankle Circles/Pumps: AROM;Both;15 reps;Supine Quad Sets: AROM;Both;10 reps;Supine Heel Slides: AAROM;Right;10 reps;Supine Straight Leg Raises: AAROM;AROM;Right;15 reps;Supine    General Comments General comments (skin integrity, edema, etc.): O2 stable on room air between 98-100%      Pertinent Vitals/Pain Pain Assessment: 0-10 Pain Score: 6  Faces Pain Scale: Hurts even more Pain Location: R knee Pain Descriptors / Indicators: Sore;Grimacing;Operative site guarding Pain Intervention(s): Limited activity within patient's tolerance;Monitored during session;Premedicated before session;Ice applied    Home Living Family/patient expects to be discharged to:: Private residence Living Arrangements: Children;Other (Comment) (grandson) Available Help at Discharge: Family;Available PRN/intermittently Type of Home: House Home Access: Stairs to enter Entrance Stairs-Rails: None Home Layout: One level Home Equipment: Crutches      Prior Function  Level of Independence: Needs assistance  Gait / Transfers Assistance  Needed: using crutches, per DTR patient has "basically been bed bound for 4-5 months" due to knee pain ADL's / Homemaking Assistance Needed: has help with dressing, bathing, IADLs such as grocery shopping from DTR that lives nearby. DTR reports patient was using crutches to get to bathroom for toileting     PT Goals (current goals can now be found in the care plan section) Acute Rehab PT Goals Patient Stated Goal: home PT Goal Formulation: With patient Time For Goal Achievement: 04/21/20 Potential to Achieve Goals: Good Progress towards PT goals: Progressing toward goals    Frequency    7X/week      PT Plan Current plan remains appropriate    Co-evaluation              AM-PAC PT "6 Clicks" Mobility   Outcome Measure  Help needed turning from your back to your side while in a flat bed without using bedrails?: A Little Help needed moving from lying on your back to sitting on the side of a flat bed without using bedrails?: A Little Help needed moving to and from a bed to a chair (including a wheelchair)?: A Little Help needed standing up from a chair using your arms (e.g., wheelchair or bedside chair)?: A Little Help needed to walk in hospital room?: A Little Help needed climbing 3-5 steps with a railing? : Total 6 Click Score: 16    End of Session Equipment Utilized During Treatment: Gait belt Activity Tolerance: Patient tolerated treatment well Patient left: in bed;with call bell/phone within reach;with family/visitor present Nurse Communication: Mobility status PT Visit Diagnosis: Difficulty in walking, not elsewhere classified (R26.2)     Time: 4431-5400 PT Time Calculation (min) (ACUTE ONLY): 28 min  Charges:  $Gait Training: 8-22 mins $Therapeutic Exercise: 8-22 mins                     Jessica Lopez PT Acute Rehabilitation Services Pager [phone] Office [phone]    Duward Allbritton 04/15/2020, 12:29 PM

## 2020-04-15 NOTE — Progress Notes (Signed)
    Subjective:  Patient reports pain as moderate to severe.  Denies N/V/CP/SOB. C/o R knee pain.  Objective:   VITALS:   Vitals:   04/14/20 2253 04/14/20 2300 04/15/20 0330 04/15/20 0512  BP: (!) 130/41   (!) 144/61  Pulse: 66  60 61  Resp: 19  16 16   Temp: 98.4 F (36.9 C)   98.1 F (36.7 C)  TempSrc: Oral   Oral  SpO2: 100%  98% 100%  Weight:  92.5 kg    Height:  5\' 5"  (1.651 m)      NAD ABD soft Sensation intact distally Intact pulses distally Dorsiflexion/Plantar flexion intact incis VAC intact, no leak  Lab Results  Component Value Date   WBC 8.8 04/07/2020   HGB 13.9 04/07/2020   HCT 44.0 04/07/2020   MCV 91.7 04/07/2020   PLT 294 04/07/2020   BMET    Component Value Date/Time   NA 139 04/07/2020 1338   K 4.2 04/07/2020 1338   CL 103 04/07/2020 1338   CO2 26 04/07/2020 1338   GLUCOSE 94 04/07/2020 1338   BUN 13 04/07/2020 1338   CREATININE 0.89 04/07/2020 1338   CALCIUM 9.9 04/07/2020 1338   GFRNONAA >60 04/07/2020 1338   GFRAA >60 04/07/2020 1338     Assessment/Plan: 1 Day Post-Op   Principal Problem:   Failed total knee, right (HCC)   WBAT with walker DVT ppx: Aspirin, SCDs, TEDS PO pain control PT/OT Dispo: anticipate d/c tomorrow, cont PT/OT and pain control   06/07/2020 Nixon Kolton 04/15/2020, 7:26 AM   Iline Oven, MD [phone] [facility] is now [facility]  [place] [address], [place], [address] [place] Phone: [phone] [email or web address] Facebook  [phone]

## 2020-04-15 NOTE — Progress Notes (Signed)
Pt requesting Oxazepam at bedtime for insomnia, MD is notified and received verbal order. Orders carried out.

## 2020-04-15 NOTE — Progress Notes (Signed)
Physical Therapy Treatment Patient Details Name: Jessica Lopez MRN: [id number] DOB: 1940/11/11 Today's Date: 04/15/2020    History of Present Illness Patient is a 79 year old female s/p R TKA revision. PMH: L TKA in 2004 per DTR, HTN, anxiety    PT Comments    Pt cooperative and with increased activity tolerance and ambulatory stability noted.   Follow Up Recommendations  Follow surgeon's recommendation for DC plan and follow-up therapies     Equipment Recommendations  Rolling walker with 5" wheels;3in1 (PT)    Recommendations for Other Services       Precautions / Restrictions Precautions Precautions: Fall;Knee Restrictions Weight Bearing Restrictions: No Other Position/Activity Restrictions: WBAT    Mobility  Bed Mobility Overal bed mobility: Needs Assistance Bed Mobility: Sit to Supine;Supine to Sit     Supine to sit: Min guard;HOB elevated Sit to supine: Min guard   General bed mobility comments: increased time and use of bed rail  Transfers Overall transfer level: Needs assistance Equipment used: Rolling walker (2 wheeled) Transfers: Sit to/from Stand Sit to Stand: Min assist         General transfer comment: cues for LE management and use of UEs to self assist  Ambulation/Gait Ambulation/Gait assistance: Min assist Gait Distance (Feet): 100 Feet Assistive device: Rolling walker (2 wheeled) Gait Pattern/deviations: Step-to pattern;Decreased step length - right;Decreased step length - left;Shuffle;Trunk flexed Gait velocity: decr   General Gait Details: Increased time with cues for sequence, posture and position from [employer]             Wheelchair Mobility    Modified Rankin (Stroke Patients Only)       Balance Overall balance assessment: Needs assistance Sitting-balance support: Feet supported Sitting balance-Leahy Scale: Fair     Standing balance support: Bilateral upper extremity supported Standing balance-Leahy Scale:  Poor Standing balance comment: reliant on UEs                            Cognition Arousal/Alertness: Awake/alert Behavior During Therapy: WFL for tasks assessed/performed Overall Cognitive Status: Impaired/Different from baseline Area of Impairment: Memory;Problem solving;Safety/judgement                     Memory: Decreased short-term memory Following Commands: Follows one step commands consistently Safety/Judgement: Decreased awareness of safety   Problem Solving: Requires verbal cues General Comments: repeatedly asks dtr to answer questions       Exercises      General Comments        Pertinent Vitals/Pain Pain Assessment: 0-10 Pain Score: 6  Pain Location: R knee Pain Descriptors / Indicators: Sore;Grimacing;Operative site guarding Pain Intervention(s): Limited activity within patient's tolerance;Monitored during session;Premedicated before session;Ice applied    Home Living                      Prior Function            PT Goals (current goals can now be found in the care plan section) Acute Rehab PT Goals Patient Stated Goal: home PT Goal Formulation: With patient Time For Goal Achievement: 04/21/20 Potential to Achieve Goals: Good Progress towards PT goals: Progressing toward goals    Frequency    7X/week      PT Plan Current plan remains appropriate    Co-evaluation              AM-PAC PT "6 Clicks" Mobility  Outcome Measure  Help needed turning from your back to your side while in a flat bed without using bedrails?: A Little Help needed moving from lying on your back to sitting on the side of a flat bed without using bedrails?: A Little Help needed moving to and from a bed to a chair (including a wheelchair)?: A Little Help needed standing up from a chair using your arms (e.g., wheelchair or bedside chair)?: A Little Help needed to walk in hospital room?: A Little Help needed climbing 3-5 steps with a  railing? : A Lot 6 Click Score: 17    End of Session Equipment Utilized During Treatment: Gait belt Activity Tolerance: Patient tolerated treatment well Patient left: in bed;with call bell/phone within reach;with family/visitor present Nurse Communication: Mobility status PT Visit Diagnosis: Difficulty in walking, not elsewhere classified (R26.2)     Time: 4401-0272 PT Time Calculation (min) (ACUTE ONLY): 30 min  Charges:  $Gait Training: 23-37 mins                     Mauro Kaufmann PT Acute Rehabilitation Services Pager [phone] Office [phone]    Jessica Lopez 04/15/2020, 5:14 PM

## 2020-04-15 NOTE — Progress Notes (Signed)
Occupational Therapy Evaluation   Patient with functional deficits listed below impacting safety and independence with self care. Prior to surgery patient's DTR reports patient was "basically bed bound" and using crutches for limited mobility. Had assist from her other DTR that lives nearby with self care, also lives with a grandson that works. Currently patient required mod A to stand, min A to stand pivot to recliner with walker and cues for safe body mechanics. Patient reporting dizziness with sitting upright BP 146/63, taken again in recliner 134/52, RN made aware. Patient also with short term memory deficits making her fall risk.   If patient and family can arrange 24/7 initial assist/supervision recommend [facility], otherwise SNF rehab to maximize patient safety and independence with ADLs and functional transfers.    04/15/20 0900  OT Visit Information  Last OT Received On 04/15/20  Assistance Needed +1  History of Present Illness Patient is a 79 year old female s/p R TKA revision. PMH: L TKA in 2004 per DTR, HTN, anxiety  Precautions  Precautions Fall;Knee  Restrictions  Other Position/Activity Restrictions WBAT  Home Living  Family/patient expects to be discharged to: Private residence  Living Arrangements Children;Other (Comment) (grandson)  Available Help at Discharge Family;Available PRN/intermittently  Type of Home House  Home Access Stairs to enter  Entrance Stairs-Number of Steps 1  Entrance Stairs-Rails None  Home Layout One level  Home Equipment Crutches  Prior Function  Level of Independence Needs assistance  Gait / Transfers Assistance Needed using crutches, per DTR patient has "basically been bed bound for 4-5 months" due to knee pain  ADL's / Homemaking Assistance Needed has help with dressing, bathing, IADLs such as grocery shopping from DTR that lives nearby. DTR reports patient was using crutches to get to bathroom for toileting  Communication / Swallowing Assistance  Needed '  Communication  Communication No difficulties  Pain Assessment  Pain Assessment Faces  Faces Pain Scale 6  Pain Location R knee, stomach  Pain Descriptors / Indicators Sore;Grimacing;Operative site guarding  Pain Intervention(s) Monitored during session  Cognition  Arousal/Alertness Awake/alert  Behavior During Therapy WFL for tasks assessed/performed  Overall Cognitive Status Impaired/Different from baseline  Area of Impairment Orientation;Memory;Following commands;Safety/judgement;Problem solving  Orientation Level Disoriented to;Place;Time  Memory Decreased short-term memory  Following Commands Follows one step commands with increased time  Safety/Judgement Decreased awareness of safety  Problem Solving Requires verbal cues;Requires tactile cues  General Comments asked DTR if patient is normally oriented to month/year DTR states "If someone tells her she will remember" patient can't remember taking her "memory pills" while OT in the room   Upper Extremity Assessment  Upper Extremity Assessment Generalized weakness  Lower Extremity Assessment  Lower Extremity Assessment Defer to PT evaluation  ADL  Overall ADL's  Needs assistance/impaired  Eating/Feeding Set up;Sitting  Grooming Set up;Sitting;Wash/dry face;Wash/dry hands  Upper Body Bathing Sitting;Set up  Lower Body Bathing Maximal assistance;Sitting/lateral leans;Sit to/from stand  Upper Body Dressing  Set up;Sitting  Lower Body Dressing Total assistance;Sitting/lateral leans;Sit to/from Scientist, research (life sciences) Moderate assistance;Minimal assistance;Stand-pivot;Cueing for safety;Cueing for sequencing;BSC;RW  Toilet Transfer Details (indicate cue type and reason) to recliner, mod A to power up to standing, min A to pivot to recliner with cues for hand placement  Toileting- Clothing Manipulation and Hygiene Maximal assistance;Sitting/lateral lean;Sit to/from stand  Functional mobility during ADLs Moderate  assistance;Cueing for safety;Cueing for sequencing;Rolling walker  General ADL Comments patient requiring increased assistance for self care tasks due to pain, decreased activity tolerance, safety  awareness, balance, strength  Bed Mobility  Overal bed mobility Needs Assistance  Bed Mobility Supine to Sit  Supine to sit Min guard;HOB elevated  General bed mobility comments increased time and use of bed rail  Transfers  Overall transfer level Needs assistance  Equipment used Rolling walker (2 wheeled)  Transfers Sit to/from Stand;Stand Pivot Transfers  Sit to Stand Mod assist  Stand pivot transfers Min assist  General transfer comment mod A to power up to standing with cues for hand placement, min A for stability to pivot to recliner with cues to reach back  Balance  Overall balance assessment Needs assistance  Sitting-balance support Feet supported  Sitting balance-Leahy Scale Fair  Standing balance support Bilateral upper extremity supported  Standing balance-Leahy Scale Poor  Standing balance comment reliant on UEs  General Comments  General comments (skin integrity, edema, etc.) O2 stable on room air between 98-100%  OT - End of Session  Equipment Utilized During Treatment Rolling walker  Activity Tolerance Patient tolerated treatment well  Patient left in chair;with call bell/phone within reach;with chair alarm set  Nurse Communication Mobility status;Other (comment) (BP)  OT Assessment  OT Recommendation/Assessment Patient needs continued OT Services  OT Visit Diagnosis Unsteadiness on feet (R26.81);Other abnormalities of gait and mobility (R26.89);Muscle weakness (generalized) (M62.81);Pain  Pain - Right/Left Right  Pain - part of body Knee  OT Problem List Decreased strength;Decreased activity tolerance;Impaired balance (sitting and/or standing);Decreased cognition;Decreased safety awareness;Decreased knowledge of use of DME or AE;Obesity;Pain  Barriers to Discharge Comments  per patient does not have 24/7 assistance currently   OT Plan  OT Frequency (ACUTE ONLY) Min 2X/week  OT Treatment/Interventions (ACUTE ONLY) Self-care/ADL training;Energy conservation;DME and/or AE instruction;Therapeutic activities;Patient/family education;Balance training  AM-PAC OT "6 Clicks" Daily Activity Outcome Measure (Version 2)  Help from another person eating meals? 3  Help from another person taking care of personal grooming? 3  Help from another person toileting, which includes using toliet, bedpan, or urinal? 2  Help from another person bathing (including washing, rinsing, drying)? 2  Help from another person to put on and taking off regular upper body clothing? 3  Help from another person to put on and taking off regular lower body clothing? 1  6 Click Score 14  OT Recommendation  Follow Up Recommendations SNF;Other (comment) (vs HH if 24/7 assist )  OT Equipment 3 in 1 bedside commode;Other (comment) (rolling walker )  Individuals Consulted  Consulted and Agree with Results and Recommendations Patient  Acute Rehab OT Goals  Patient Stated Goal home  OT Goal Formulation With patient  Time For Goal Achievement 04/29/20  Potential to Achieve Goals Good  OT Time Calculation  OT Start Time (ACUTE ONLY) 0748  OT Stop Time (ACUTE ONLY) 0826  OT Time Calculation (min) 38 min  OT General Charges  $OT Visit 1 Visit  OT Evaluation  $OT Eval Low Complexity 1 Low  OT Treatments  $Self Care/Home Management  23-37 mins  Written Expression  Dominant Hand Left   Marlyce Huge OT OT pager: [phone]

## 2020-04-15 NOTE — Discharge Summary (Signed)
Physician Discharge Summary  Patient ID: Jessica Lopez MRN: [id number] DOB/AGE: Sep 04, 1940 79 y.o.  Admit date: 04/14/2020 Discharge date: 04/16/2020  Admission Diagnoses:  Failed total knee, right [facility])  Discharge Diagnoses:  Principal Problem:   Failed total knee, right (HCC)   Past Medical History:  Diagnosis Date   Anemia    Anginal pain (HCC)    due to medication   Anxiety    CHF (congestive heart failure) (HCC)    Chronic constipation    Chronic kidney disease 2013   history of   COPD (chronic obstructive pulmonary disease) (HCC)    rare use of inhaler   GERD (gastroesophageal reflux disease)    Headache(784.0)    migraines   Hypertension    Lower back pain    Osteoarthritis    Diagnosed with RA. Pt not sure    Surgeries: Procedure(s): TOTAL KNEE REVISION on 04/14/2020   Consultants (if any):   Discharged Condition: Improved  Hospital Course: Jessica Lopez is an 79 y.o. female who was admitted 04/14/2020 with a diagnosis of Failed total knee, right (HCC) and went to the operating room on 04/14/2020 and underwent the above named procedures.    She was given perioperative antibiotics:  Anti-infectives (From admission, onward)   Start     Dose/Rate Route Frequency Ordered Stop   04/14/20 1600  ceFAZolin (ANCEF) IVPB 2g/100 mL premix        2 g 200 mL/hr over 30 Minutes Intravenous Every 6 hours 04/14/20 1526 04/14/20 2237   04/14/20 1157  vancomycin (VANCOCIN) powder  Status:  Discontinued          As needed 04/14/20 1157 04/14/20 1445   04/14/20 0924  vancomycin (VANCOCIN) 1-5 GM/200ML-% IVPB       Note to Pharmacy: Sharyn Creamer   : cabinet override      04/14/20 0924 04/14/20 2129   04/14/20 0630  ceFAZolin (ANCEF) IVPB 2g/100 mL premix        2 g 200 mL/hr over 30 Minutes Intravenous On call to O.R. 04/14/20 0156 04/14/20 0857    .  She was given sequential compression devices, early ambulation, and apixaban for DVT prophylaxis.  She  benefited maximally from the hospital stay and there were no complications.    Recent vital signs:  Vitals:   04/16/20 0701 04/16/20 0818  BP: (!) 158/62   Pulse: (!) 59   Resp: 18   Temp: (!) 97.4 F (36.3 C)   SpO2: 98% 98%    Recent laboratory studies:  Lab Results  Component Value Date   HGB 9.5 (L) 04/16/2020   HGB 9.9 (L) 04/15/2020   HGB 13.9 04/07/2020   Lab Results  Component Value Date   WBC 14.5 (H) 04/16/2020   PLT 187 04/16/2020   Lab Results  Component Value Date   INR 0.9 04/07/2020   Lab Results  Component Value Date   NA 139 04/15/2020   K 4.3 04/15/2020   CL 107 04/15/2020   CO2 22 04/15/2020   BUN 11 04/15/2020   CREATININE 0.89 04/15/2020   GLUCOSE 113 (H) 04/15/2020     Recent Results (from the past 240 hour(s))  Surgical pcr screen     Status: None   Collection Time: 04/07/20  1:38 PM   Specimen: Nasal Mucosa; Nasal Swab  Result Value Ref Range Status   MRSA, PCR NEGATIVE NEGATIVE Final   Staphylococcus aureus NEGATIVE NEGATIVE Final    Comment: (NOTE) The Xpert SA Assay (  FDA approved for NASAL specimens in patients 54 years of age and older), is one component of a comprehensive surveillance program. It is not intended to diagnose infection nor to guide or monitor treatment. Performed at [facility], [address] [place], [place] [address]   SARS CORONAVIRUS 2 (TAT 6-24 HRS) Nasopharyngeal Nasopharyngeal Swab     Status: None   Collection Time: 04/10/20  1:55 PM   Specimen: Nasopharyngeal Swab  Result Value Ref Range Status   SARS Coronavirus 2 NEGATIVE NEGATIVE Final    Comment: (NOTE) SARS-CoV-2 target nucleic acids are NOT DETECTED.  The SARS-CoV-2 RNA is generally detectable in upper and lower respiratory specimens during the acute phase of infection. Negative results do not preclude SARS-CoV-2 infection, do not rule out co-infections with other pathogens, and should not be used as the sole basis for  treatment or other patient management decisions. Negative results must be combined with clinical observations, patient history, and epidemiological information. The expected result is Negative.  Fact Sheet for Patients: HairSlick.no  Fact Sheet for Healthcare Providers: quierodirigir.com  This test is not yet approved or cleared by the [place] FDA and  has been authorized for detection and/or diagnosis of SARS-CoV-2 by FDA under an Emergency Use Authorization (EUA). This EUA will remain  in effect (meaning this test can be used) for the duration of the COVID-19 declaration under Se ction 564(b)(1) of the Act, 21 U.S.C. section 360bbb-3(b)(1), unless the authorization is terminated or revoked sooner.  Performed at [facility], [address] [place], [place] [address]   Aerobic/Anaerobic Culture (surgical/deep wound)     Status: None (Preliminary result)   Collection Time: 04/14/20 10:05 AM   Specimen: PATH Soft tissue  Result Value Ref Range Status   Specimen Description   Final    TISSUE RT FEMORAL INTERFACE MEMBRANE Performed at [facility], [address] [place], [place] [address]    Special Requests   Final    NONE Performed at [facility], [address], [place], [place] [address]    Gram Stain   Final    NO WBC SEEN NO ORGANISMS SEEN Gram Stain Report Called to,Read Back By and Verified With: SWYGERT,A @ 1106 ON [id number] BY POTEAT,S Performed at [facility], [address] [place], [place] [address]    Culture   Final    NO GROWTH < 24 HOURS Performed at [facility], [address] [place], [place] [address]    Report Status PENDING  Incomplete      Discharge Medications:   Allergies as of 04/16/2020      Reactions   Dilaudid [hydromorphone]    "made my heart hurt"    Hydrocodone Itching      Medication List    STOP  taking these medications   acetaminophen 650 MG CR tablet Commonly known as: TYLENOL   rivaroxaban 10 MG Tabs tablet Commonly known as: Xarelto     TAKE these medications   amLODipine-benazepril 5-20 MG capsule Commonly known as: LOTREL Take 1 capsule by mouth every morning.   apixaban 2.5 MG Tabs tablet Commonly known as: ELIQUIS Take 1 tablet (2.5 mg total) by mouth 2 (two) times daily.   busPIRone 15 MG tablet Commonly known as: BUSPAR Take 15 mg by mouth every 8 (eight) hours as needed (anxiety).   clonazePAM 0.5 MG tablet Commonly known as: KLONOPIN Take one tablet by mouth at bedtime as needed  for anxiety/sleep; Take two tablets by mouth at bedtime as needed for anxiety/sleep What changed:   how much to take  how to take this  when to take this  additional instructions   docusate sodium 100 MG capsule Commonly known as: COLACE Take 1 capsule (100 mg total) by mouth 2 (two) times daily.   donepezil 10 MG tablet Commonly known as: ARICEPT Take 10 mg by mouth daily.   FISH OIL PO Take 1 capsule by mouth daily.   folic acid 1 MG tablet Commonly known as: FOLVITE Take 1 mg by mouth daily.   furosemide 20 MG tablet Commonly known as: LASIX Take 20 mg by mouth daily.   gabapentin 300 MG capsule Commonly known as: NEURONTIN Take 300-600 mg by mouth See admin instructions. Take 600 mg in the morning and 300 mg in the evening   HYDROmorphone 2 MG tablet Commonly known as: DILAUDID Take 0.5 tablets (1 mg total) by mouth every 4 (four) hours as needed for severe pain.   ibuprofen 800 MG tablet Commonly known as: ADVIL Take 800 mg by mouth every 8 (eight) hours as needed for moderate pain.   Klor-Con M20 20 MEQ tablet Generic drug: potassium chloride SA Take 20 mEq by mouth daily.   linaclotide 290 MCG Caps capsule Commonly known as: Linzess Take 290 mcg by mouth daily.   metoprolol tartrate 25 MG tablet Commonly known as: LOPRESSOR Take 1 tablet  (25 mg total) by mouth 2 (two) times daily.   MUSCLE RUB EX Apply 1 application topically daily as needed (pain).   ondansetron 4 MG tablet Commonly known as: ZOFRAN Take 1 tablet (4 mg total) by mouth every 6 (six) hours as needed for nausea.   oxazepam 15 MG capsule Commonly known as: SERAX Take 15 mg by mouth at bedtime.   senna 8.6 MG Tabs tablet Commonly known as: SENOKOT Take 2 tablets (17.2 mg total) by mouth at bedtime.   Suboxone 12-3 MG Film Generic drug: Buprenorphine HCl-Naloxone HCl Place 1 Film under the tongue 3 (three) times daily.   Symbicort 160-4.5 MCG/ACT inhaler Generic drug: budesonide-formoterol Inhale 1 puff into the lungs 2 (two) times daily as needed for shortness of breath.   traMADol 50 MG tablet Commonly known as: ULTRAM Take 50 mg by mouth every 8 (eight) hours as needed for moderate pain.   Vitamin D (Ergocalciferol) 1.25 MG (50000 UNIT) Caps capsule Commonly known as: DRISDOL Take 50,000 Units by mouth every 7 (seven) days.            Durable Medical Equipment  (From admission, onward)         Start     Ordered   04/14/20 1527  DME Walker rolling  Once       Question Answer Comment  Walker: With 5 Inch Wheels   Patient needs a walker to treat with the following condition Failed total knee, right (HCC)      04/14/20 1526   04/14/20 1527  DME 3 n 1  Once        04/14/20 1526          Diagnostic Studies: DG Knee Right Port  Result Date: 04/14/2020 CLINICAL DATA:  Status post right total knee replacement. EXAM: PORTABLE RIGHT KNEE - 1-2 VIEW COMPARISON:  March 09, 2005. FINDINGS: Status post revision of right total knee arthroplasty, with longer stem components. The femoral and tibial components appear to be well situated. Expected postoperative changes are noted in the soft  tissues anteriorly. IMPRESSION: Status post revision of right total knee arthroplasty. Electronically Signed   By: Lupita Raider M.D.   On: 04/14/2020  13:55    Disposition: Discharge disposition: 01-Home or Self Care       Discharge Instructions    Call MD / Call 911   Complete by: As directed    If you experience chest pain or shortness of breath, CALL 911 and be transported to the hospital emergency room.  If you develope a fever above 101 F, pus (white drainage) or increased drainage or redness at the wound, or calf pain, call your surgeon's office.   Constipation Prevention   Complete by: As directed    Drink plenty of fluids.  Prune juice may be helpful.  You may use a stool softener, such as Colace (over the counter) 100 mg twice a day.  Use MiraLax (over the counter) for constipation as needed.   Diet - low sodium heart healthy   Complete by: As directed    Discharge instructions   Complete by: As directed    Keep VAC dressing clean and dry. Do NOT remove.  Charge VAC unit nightly.   Do not put a pillow under the knee. Place it under the heel.   Complete by: As directed    Driving restrictions   Complete by: As directed    No driving for 6 weeks   Increase activity slowly as tolerated   Complete by: As directed    Lifting restrictions   Complete by: As directed    No lifting for 6 weeks   TED hose   Complete by: As directed    Use stockings (TED hose) for 2 weeks on both leg(s).  You may remove them at night for sleeping.       Follow-up Information    Samson Frederic, MD Follow up on 04/23/2020.   Specialty: Orthopedic Surgery Why: [facility] removal Contact information: [address] [place] [address] [place] [address] [phone]                Signed: Iline Oven Zadie Deemer 04/16/2020, 10:06 AM

## 2020-04-16 ENCOUNTER — Encounter (HOSPITAL_COMMUNITY): Payer: Self-pay | Admitting: Orthopedic Surgery

## 2020-04-16 LAB — CBC
HCT: 30.4 % — ABNORMAL LOW (ref 36.0–46.0)
Hemoglobin: 9.5 g/dL — ABNORMAL LOW (ref 12.0–15.0)
MCH: 29.8 pg (ref 26.0–34.0)
MCHC: 31.3 g/dL (ref 30.0–36.0)
MCV: 95.3 fL (ref 80.0–100.0)
Platelets: 187 10*3/uL (ref 150–400)
RBC: 3.19 MIL/uL — ABNORMAL LOW (ref 3.87–5.11)
RDW: 13 % (ref 11.5–15.5)
WBC: 14.5 10*3/uL — ABNORMAL HIGH (ref 4.0–10.5)
nRBC: 0 % (ref 0.0–0.2)

## 2020-04-16 MED ORDER — HYDROMORPHONE HCL 2 MG PO TABS: 1.0000 mg | ORAL_TABLET | ORAL | 0 refills | Status: AC | PRN

## 2020-04-16 MED ORDER — APIXABAN 2.5 MG PO TABS: 2.5000 mg | ORAL_TABLET | Freq: Two times a day (BID) | ORAL | 0 refills | Status: AC

## 2020-04-16 MED ORDER — APIXABAN 2.5 MG PO TABS
2.5000 mg | ORAL_TABLET | Freq: Two times a day (BID) | ORAL | Status: DC
  Administered 2020-04-16: 2.5 mg via ORAL
  Filled 2020-04-16: qty 1

## 2020-04-16 MED ORDER — HYDROMORPHONE HCL 2 MG PO TABS
1.0000 mg | ORAL_TABLET | ORAL | Status: DC | PRN
  Administered 2020-04-16: 1 mg via ORAL
  Filled 2020-04-16: qty 1

## 2020-04-16 NOTE — Anesthesia Postprocedure Evaluation (Signed)
Anesthesia Post Note  Patient: Jessica Lopez  Procedure(s) Performed: TOTAL KNEE REVISION (Right Knee)     Patient location during evaluation: PACU Anesthesia Type: Spinal Level of consciousness: awake and alert Pain management: pain level controlled Vital Signs Assessment: post-procedure vital signs reviewed and stable Respiratory status: spontaneous breathing and respiratory function stable Cardiovascular status: blood pressure returned to baseline and stable Postop Assessment: spinal receding Anesthetic complications: no   No complications documented.  Last Vitals:  Vitals:   04/15/20 2009 04/15/20 2101  BP:  (!) 128/51  Pulse:  67  Resp:  18  Temp:  36.8 C  SpO2: 96% 97%    Last Pain:  Vitals:   04/16/20 0104  TempSrc:   PainSc: 9                  Kennieth Rad

## 2020-04-16 NOTE — Progress Notes (Signed)
Physical Therapy Treatment Patient Details Name: Jessica Lopez MRN: [id number] DOB: 01-Jul-1941 Today's Date: 04/16/2020    History of Present Illness Patient is a 79 year old female s/p R TKA revision. PMH: L TKA in 2004 per DTR, HTN, anxiety    PT Comments    Pt continues very cooperative and progressing steadily with mobility.  Pt hopeful for dc home this date.   Follow Up Recommendations  Follow surgeon's recommendation for DC plan and follow-up therapies;Home health PT     Equipment Recommendations  Rolling walker with 5" wheels;3in1 (PT)    Recommendations for Other Services       Precautions / Restrictions Precautions Precautions: Fall;Knee Restrictions Weight Bearing Restrictions: No Other Position/Activity Restrictions: WBAT    Mobility  Bed Mobility Overal bed mobility: Needs Assistance Bed Mobility: Supine to Sit     Supine to sit: Supervision     General bed mobility comments: increased time and no physical assist  Transfers Overall transfer level: Needs assistance Equipment used: Rolling walker (2 wheeled) Transfers: Sit to/from Stand Sit to Stand: Min assist         General transfer comment: cues for LE management and use of UEs to self assist  Ambulation/Gait Ambulation/Gait assistance: Min assist;Min guard Gait Distance (Feet): 100 Feet Assistive device: Rolling walker (2 wheeled) Gait Pattern/deviations: Step-to pattern;Decreased step length - right;Decreased step length - left;Shuffle;Trunk flexed Gait velocity: decr   General Gait Details: Increased time with cues for sequence, posture and position from [employer]             Wheelchair Mobility    Modified Rankin (Stroke Patients Only)       Balance Overall balance assessment: Needs assistance Sitting-balance support: Feet supported Sitting balance-Leahy Scale: Good     Standing balance support: Bilateral upper extremity supported Standing balance-Leahy Scale:  Poor Standing balance comment: reliant on UEs                            Cognition Arousal/Alertness: Awake/alert Behavior During Therapy: WFL for tasks assessed/performed Overall Cognitive Status: History of cognitive impairments - at baseline Area of Impairment: Memory;Problem solving;Safety/judgement;Attention                     Memory: Decreased short-term memory Following Commands: Follows one step commands consistently       General Comments: repeatedly asks dtr to answer questions       Exercises Total Joint Exercises Ankle Circles/Pumps: AROM;Both;15 reps;Supine Quad Sets: AROM;Both;10 reps;Supine Heel Slides: AAROM;Right;Supine;15 reps Straight Leg Raises: AAROM;AROM;Right;Supine;20 reps Goniometric ROM: AAROM R knee -5 - 45    General Comments        Pertinent Vitals/Pain Pain Assessment: 0-10 Pain Score: 6  Pain Location: R knee Pain Descriptors / Indicators: Sore;Grimacing;Operative site guarding Pain Intervention(s): Limited activity within patient's tolerance;Monitored during session;Premedicated before session;Ice applied    Home Living                      Prior Function            PT Goals (current goals can now be found in the care plan section) Acute Rehab PT Goals Patient Stated Goal: home PT Goal Formulation: With patient Time For Goal Achievement: 04/21/20 Potential to Achieve Goals: Good Progress towards PT goals: Progressing toward goals    Frequency    7X/week      PT Plan Current  plan remains appropriate    Co-evaluation              AM-PAC PT "6 Clicks" Mobility   Outcome Measure  Help needed turning from your back to your side while in a flat bed without using bedrails?: A Little Help needed moving from lying on your back to sitting on the side of a flat bed without using bedrails?: A Little Help needed moving to and from a bed to a chair (including a wheelchair)?: A Little Help needed  standing up from a chair using your arms (e.g., wheelchair or bedside chair)?: A Little Help needed to walk in hospital room?: A Little Help needed climbing 3-5 steps with a railing? : A Lot 6 Click Score: 17    End of Session Equipment Utilized During Treatment: Gait belt Activity Tolerance: Patient tolerated treatment well Patient left: in chair;with call bell/phone within reach;with family/visitor present Nurse Communication: Mobility status PT Visit Diagnosis: Difficulty in walking, not elsewhere classified (R26.2)     Time: 1610-9604 PT Time Calculation (min) (ACUTE ONLY): 43 min  Charges:  $Gait Training: 23-37 mins $Therapeutic Exercise: 8-22 mins                     Mauro Kaufmann PT Acute Rehabilitation Services Pager [phone] Office [phone]    Jessica Lopez 04/16/2020, 10:29 AM

## 2020-04-16 NOTE — Discharge Instructions (Signed)
Dr. Samson Frederic Total Joint Specialist [facility] [address] [place], [place] [address] [phone]  TOTAL KNEE REPLACEMENT POSTOPERATIVE DIRECTIONS    Knee Rehabilitation, Guidelines Following Surgery  Results after knee surgery are often greatly improved when you follow the exercise, range of motion and muscle strengthening exercises prescribed by your doctor. Safety measures are also important to protect the knee from further injury. Any time any of these exercises cause you to have increased pain or swelling in your knee joint, decrease the amount until you are comfortable again and slowly increase them. If you have problems or questions, call your caregiver or physical therapist for advice.   WEIGHT BEARING Weight bearing as tolerated with assist device (walker, cane, etc) as directed, use it as long as suggested by your surgeon or therapist, typically at least 4-6 weeks.  HOME CARE INSTRUCTIONS  Remove items at home which could result in a fall. This includes throw rugs or furniture in walking pathways.  Continue medications as instructed at time of discharge. You may have some home medications which will be placed on hold until you complete the course of blood thinner medication.  Walk with walker as instructed.  You may resume a sexual relationship in one month or when given the OK by your doctor.   Use walker as long as suggested by your caregivers.  Avoid periods of inactivity such as sitting longer than an hour when not asleep. This helps prevent blood clots.  You may put full weight on your legs and walk as much as is comfortable.  You may return to work once you are cleared by your doctor.  Do not drive a car for 6 weeks or until released by you surgeon.   Do not drive while taking narcotics.  Wear the elastic stockings for three weeks following surgery during the day but you may remove then at night. Make sure you keep all of your  appointments after your operation with all of your doctors and caregivers. You should call the office at the above phone number and make an appointment for approximately two weeks after the date of your surgery. Do not remove your surgical dressing. The dressing is waterproof; you may take showers in 3 days, but do not take tub baths or submerge the dressing. Please pick up a stool softener and laxative for home use as long as you are requiring pain medications.  ICE to the affected knee every three hours for 30 minutes at a time and then as needed for pain and swelling.  Continue to use ice on the knee for pain and swelling from surgery. You may notice swelling that will progress down to the foot and ankle.  This is normal after surgery.  Elevate the leg when you are not up walking on it.   It is important for you to complete the blood thinner medication as prescribed by your doctor.  Continue to use the breathing machine which will help keep your temperature down.  It is common for your temperature to cycle up and down following surgery, especially at night when you are not up moving around and exerting yourself.  The breathing machine keeps your lungs expanded and your temperature down.  RANGE OF MOTION AND STRENGTHENING EXERCISES  Rehabilitation of the knee is important following a knee injury or an operation. After just a few days of immobilization, the muscles of the thigh which control the knee become weakened and shrink (atrophy). Knee exercises are  designed to build up the tone and strength of the thigh muscles and to improve knee motion. Often times heat used for twenty to thirty minutes before working out will loosen up your tissues and help with improving the range of motion but do not use heat for the first two weeks following surgery. These exercises can be done on a training (exercise) mat, on the floor, on a table or on a bed. Use what ever works the best and is most comfortable for you  Knee exercises include:  Leg Lifts - While your knee is still immobilized in a splint or cast, you can do straight leg raises. Lift the leg to 60 degrees, hold for 3 sec, and slowly lower the leg. Repeat 10-20 times 2-3 times daily. Perform this exercise against resistance later as your knee gets better.  Quad and Hamstring Sets - Tighten up the muscle on the front of the thigh (Quad) and hold for 5-10 sec. Repeat this 10-20 times hourly. Hamstring sets are done by pushing the foot backward against an object and holding for 5-10 sec. Repeat as with quad sets.  A rehabilitation program following serious knee injuries can speed recovery and prevent re-injury in the future due to weakened muscles. Contact your doctor or a physical therapist for more information on knee rehabilitation.   SKILLED REHAB INSTRUCTIONS: If the patient is transferred to a skilled rehab facility following release from the hospital, a list of the current medications will be sent to the facility for the patient to continue.  When discharged from the skilled rehab facility, please have the facility set up the patient's Home Health Physical Therapy prior to being released. Also, the skilled facility will be responsible for providing the patient with their medications at time of release from the facility to include their pain medication, the muscle relaxants, and their blood thinner medication. If the patient is still at the rehab facility at time of the two week follow up appointment, the skilled rehab facility will also need to assist the patient in arranging follow up appointment in our office and any transportation needs.  MAKE SURE YOU:  Understand these instructions.  Will watch your condition.  Will get help right away if you are not doing well or get worse.    Pick up stool softner and laxative for home use following surgery while on pain medications. Keep VAC dressing clean and dry. Do NOT remove.  Charge VAC unit  nightly. Continue to use ice for pain and swelling after surgery. Do not use any lotions or creams on the incision until instructed by your surgeon. Call [phone] ASAP to schedule an appointment for [facility] removal on 04/23/2020  Information on my medicine - ELIQUIS (apixaban)  Why was Eliquis prescribed for you? Eliquis was prescribed for you to reduce the risk of blood clots forming after orthopedic surgery.    What do You need to know about Eliquis? Take your Eliquis TWICE DAILY - one tablet in the morning and one tablet in the evening with or without food.  It would be best to take the dose about the same time each day.  If you have difficulty swallowing the tablet whole please discuss with your pharmacist how to take the medication safely.  Take Eliquis exactly as prescribed by your doctor and DO NOT stop taking Eliquis without talking to the doctor who prescribed the medication.  Stopping without other medication to take the place of Eliquis may increase your risk of developing a  clot.  After discharge, you should have regular check-up appointments with your healthcare provider that is prescribing your Eliquis.  What do you do if you miss a dose? If a dose of ELIQUIS is not taken at the scheduled time, take it as soon as possible on the same day and twice-daily administration should be resumed.  The dose should not be doubled to make up for a missed dose.  Do not take more than one tablet of ELIQUIS at the same time.  Important Safety Information A possible side effect of Eliquis is bleeding. You should call your healthcare provider right away if you experience any of the following: ? Bleeding from an injury or your nose that does not stop. ? Unusual colored urine (red or dark brown) or unusual colored stools (red or black). ? Unusual bruising for unknown reasons. ? A serious fall or if you hit your head (even if there is no bleeding).  Some medicines may interact with  Eliquis and might increase your risk of bleeding or clotting while on Eliquis. To help avoid this, consult your healthcare provider or pharmacist prior to using any new prescription or non-prescription medications, including herbals, vitamins, non-steroidal anti-inflammatory drugs (NSAIDs) and supplements.  This website has more information on Eliquis (apixaban): http://www.eliquis.com/eliquis/home

## 2020-04-16 NOTE — Progress Notes (Signed)
Physical Therapy Treatment Patient Details Name: Jessica Lopez MRN: [id number] DOB: [birth year] Today's Date: 04/16/2020    History of Present Illness Patient is a 79 year old female s/p R TKA revision. PMH: L TKA in 2004 per DTR, HTN, anxiety    PT Comments    Pt up from bathroom to ambulate limited distance and negotiate stair.  Pt and dtr would like to attempt again but limited by fatigue.  Will follow up.   Follow Up Recommendations  Follow surgeon's recommendation for DC plan and follow-up therapies;Home health PT     Equipment Recommendations  Rolling walker with 5" wheels;3in1 (PT)    Recommendations for Other Services       Precautions / Restrictions Precautions Precautions: Fall;Knee Restrictions Weight Bearing Restrictions: No Other Position/Activity Restrictions: WBAT    Mobility  Bed Mobility Overal bed mobility: Needs Assistance Bed Mobility: Supine to Sit     Supine to sit: Supervision     General bed mobility comments: Pt in bathroom on arrival and in chair at session end  Transfers Overall transfer level: Needs assistance Equipment used: Rolling walker (2 wheeled) Transfers: Sit to/from Stand Sit to Stand: Min guard         General transfer comment: cues for LE management and use of UEs to self assist  Ambulation/Gait Ambulation/Gait assistance: Min guard Gait Distance (Feet): 40 Feet Assistive device: Rolling walker (2 wheeled) Gait Pattern/deviations: Step-to pattern;Decreased step length - right;Decreased step length - left;Shuffle;Trunk flexed Gait velocity: decr   General Gait Details: Increased time with cues for sequence, posture and position from RW   Stairs Stairs: Yes Stairs assistance: Min assist Stair Management: No rails;Step to pattern;Backwards;With walker Number of Stairs: 1 General stair comments: single step bkwd with cues for sequence and foot/RW placement.  Fwd attempted but pt unable to WB sufficiently on R LE     Wheelchair Mobility    Modified Rankin (Stroke Patients Only)       Balance Overall balance assessment: Needs assistance Sitting-balance support: Feet supported Sitting balance-Leahy Scale: Good     Standing balance support: Bilateral upper extremity supported Standing balance-Leahy Scale: Fair Standing balance comment: pt standing at sink to wash hands with no external support                            Cognition Arousal/Alertness: Awake/alert Behavior During Therapy: WFL for tasks assessed/performed Overall Cognitive Status: History of cognitive impairments - at baseline Area of Impairment: Memory;Problem solving;Safety/judgement;Attention                     Memory: Decreased short-term memory Following Commands: Follows one step commands consistently       General Comments: repeatedly asks dtr to answer questions       Exercises Total Joint Exercises Ankle Circles/Pumps: AROM;Both;15 reps;Supine Quad Sets: AROM;Both;10 reps;Supine Heel Slides: AAROM;Right;Supine;15 reps Straight Leg Raises: AAROM;AROM;Right;Supine;20 reps Goniometric ROM: AAROM R knee -5 - 45    General Comments        Pertinent Vitals/Pain Pain Assessment: 0-10 Pain Score: 5  Pain Location: R knee Pain Descriptors / Indicators: Grimacing;Sore Pain Intervention(s): Limited activity within patient's tolerance;Monitored during session;Premedicated before session    Home Living                      Prior Function            PT Goals (current goals can now  be found in the care plan section) Acute Rehab PT Goals Patient Stated Goal: home PT Goal Formulation: With patient Time For Goal Achievement: 04/21/20 Potential to Achieve Goals: Good Progress towards PT goals: Progressing toward goals    Frequency    7X/week      PT Plan Current plan remains appropriate    Co-evaluation              AM-PAC PT "6 Clicks" Mobility   Outcome  Measure  Help needed turning from your back to your side while in a flat bed without using bedrails?: A Little Help needed moving from lying on your back to sitting on the side of a flat bed without using bedrails?: A Little Help needed moving to and from a bed to a chair (including a wheelchair)?: A Little Help needed standing up from a chair using your arms (e.g., wheelchair or bedside chair)?: A Little Help needed to walk in hospital room?: A Little Help needed climbing 3-5 steps with a railing? : A Little 6 Click Score: 18    End of Session Equipment Utilized During Treatment: Gait belt Activity Tolerance: Patient tolerated treatment well;Patient limited by fatigue Patient left: in chair;with call bell/phone within reach;with family/visitor present Nurse Communication: Mobility status PT Visit Diagnosis: Difficulty in walking, not elsewhere classified (R26.2)     Time: 7001-7494 PT Time Calculation (min) (ACUTE ONLY): 21 min  Charges:  $Gait Training: 8-22 mins $Therapeutic Exercise: 8-22 mins                     Mauro Kaufmann PT Acute Rehabilitation Services Pager [phone] Office [phone]    Tylasia Fletchall 04/16/2020, 12:14 PM

## 2020-04-16 NOTE — Plan of Care (Signed)
Pt ready to be DC'd home with daughter

## 2020-04-16 NOTE — Progress Notes (Addendum)
Subjective:  Patient reports pain as moderate.  Denies N/V/CP/SOB. Pain control better with dilaudid  Objective:   VITALS:   Vitals:   04/15/20 2009 04/15/20 2101 04/16/20 0701 04/16/20 0818  BP:  (!) 128/51 (!) 158/62   Pulse:  67 (!) 59   Resp:  18 18   Temp:  98.2 F (36.8 C) (!) 97.4 F (36.3 C)   TempSrc:  Oral Oral   SpO2: 96% 97% 98% 98%  Weight:      Height:        NAD ABD soft Sensation intact distally Intact pulses distally Dorsiflexion/Plantar flexion intact incis VAC intact, no leak  Lab Results  Component Value Date   WBC 14.5 (H) 04/16/2020   HGB 9.5 (L) 04/16/2020   HCT 30.4 (L) 04/16/2020   MCV 95.3 04/16/2020   PLT 187 04/16/2020   BMET    Component Value Date/Time   NA 139 04/15/2020 0713   K 4.3 04/15/2020 0713   CL 107 04/15/2020 0713   CO2 22 04/15/2020 0713   GLUCOSE 113 (H) 04/15/2020 0713   BUN 11 04/15/2020 0713   CREATININE 0.89 04/15/2020 0713   CALCIUM 8.4 (L) 04/15/2020 0713   GFRNONAA >60 04/15/2020 0713   GFRAA >60 04/15/2020 0713   Recent Results (from the past 240 hour(s))  Surgical pcr screen     Status: None   Collection Time: 04/07/20  1:38 PM   Specimen: Nasal Mucosa; Nasal Swab  Result Value Ref Range Status   MRSA, PCR NEGATIVE NEGATIVE Final   Staphylococcus aureus NEGATIVE NEGATIVE Final    Comment: (NOTE) The Xpert SA Assay (FDA approved for NASAL specimens in patients 79 years of age and older), is one component of a comprehensive surveillance program. It is not intended to diagnose infection nor to guide or monitor treatment. Performed at [facility], [address] [place], [place] [address]   SARS CORONAVIRUS 2 (TAT 6-24 HRS) Nasopharyngeal Nasopharyngeal Swab     Status: None   Collection Time: 04/10/20  1:55 PM   Specimen: Nasopharyngeal Swab  Result Value Ref Range Status   SARS Coronavirus 2 NEGATIVE NEGATIVE Final    Comment: (NOTE) SARS-CoV-2 target nucleic acids are  NOT DETECTED.  The SARS-CoV-2 RNA is generally detectable in upper and lower respiratory specimens during the acute phase of infection. Negative results do not preclude SARS-CoV-2 infection, do not rule out co-infections with other pathogens, and should not be used as the sole basis for treatment or other patient management decisions. Negative results must be combined with clinical observations, patient history, and epidemiological information. The expected result is Negative.  Fact Sheet for Patients: HairSlick.no  Fact Sheet for Healthcare Providers: quierodirigir.com  This test is not yet approved or cleared by the [place] FDA and  has been authorized for detection and/or diagnosis of SARS-CoV-2 by FDA under an Emergency Use Authorization (EUA). This EUA will remain  in effect (meaning this test can be used) for the duration of the COVID-19 declaration under Se ction 564(b)(1) of the Act, 21 U.S.C. section 360bbb-3(b)(1), unless the authorization is terminated or revoked sooner.  Performed at [facility], [address] [place], [place] [address]   Aerobic/Anaerobic Culture (surgical/deep wound)     Status: None (Preliminary result)   Collection Time: 04/14/20 10:05 AM   Specimen: PATH Soft tissue  Result Value Ref Range Status   Specimen Description   Final    TISSUE RT FEMORAL INTERFACE  MEMBRANE Performed at [facility], [address], [place], [place] [address]    Special Requests   Final    NONE Performed at [facility], [address] [place], [place] [address]    Gram Stain   Final    NO WBC SEEN NO ORGANISMS SEEN Gram Stain Report Called to,Read Back By and Verified With: SWYGERT,A @ 1106 ON [id number] BY POTEAT,S Performed at [facility], [address] [place], [place] [address]    Culture   Final    NO GROWTH < 24 HOURS Performed at  [facility], [address], [place], [place] [address]    Report Status PENDING  Incomplete     Assessment/Plan: 2 Days Post-Op   Principal Problem:   Failed total knee, right (HCC)   WBAT with walker DVT ppx: Aspirin, SCDs, TEDS PO pain control PT/OT Dispo: d/c home with HHPT, upon d/c switch VAC to portable prevena unit   Iline Oven Tanya Marvin 04/16/2020, 10:01 AM   Samson Frederic, MD [phone] [facility] is now [facility]  [place] [address], [address], [place], [place] [address] Phone: [phone] [email or web address] Facebook  [employer]

## 2020-04-16 NOTE — Care Management Important Message (Signed)
Important Message  Patient Details IM Letter given to the Patient Name: Jessica Lopez MRN: [id number] Date of Birth: 1941-05-19   Medicare Important Message Given:  Yes     Caren Macadam 04/16/2020, 11:20 AM

## 2020-04-16 NOTE — TOC Transition Note (Signed)
Transition of Care [facility]) - CM/SW Discharge Note   Patient Details  Name: Jessica Lopez MRN: [id number] Date of Birth: 06/06/1941  [facility] of [facility]) CM/SW Contact:  Amada Jupiter, LCSW Phone Number: 04/16/2020, 10:22 AM   Clinical Narrative:   Pt cleared for dc today. HH and DME referrals completed (see below).  No further TOC needs.    Final next level of care: Home w Home Health Services Barriers to Discharge: Barriers Resolved   Patient Goals and CMS Choice Patient states their goals for this hospitalization and ongoing recovery are:: return home CMS Medicare.gov Compare Post Acute Care list provided to:: Patient Choice offered to / list presented to : Patient  Discharge Placement                       Discharge Plan and Services                DME Arranged: 3-N-1, Walker rolling DME Agency: [employer] Date DME Agency Contacted: 04/15/20 Time DME Agency Contacted: 0930 Representative spoke with at DME Agency: Olegario Messier HH Arranged: PT HH Agency: [facility] [place], [place]) Date [facility] Agency Contacted: 04/16/20 Time HH Agency Contacted: 1010 Representative spoke with at [facility] Agency: Angelique Blonder  Social Determinants of Health (SDOH) Interventions     Readmission Risk Interventions Readmission Risk Prevention Plan 04/15/2020  Post Dischage Appt Complete  Medication Screening Complete  Transportation Screening Complete  Some recent data might be hidden

## 2020-04-16 NOTE — Progress Notes (Signed)
Physical Therapy Treatment Patient Details Name: Jessica Lopez MRN: [id number] DOB: 1941-04-04 Today's Date: 04/16/2020    History of Present Illness Patient is a 79 year old female s/p R TKA revision. PMH: L TKA in 2004 per DTR, HTN, anxiety    PT Comments    Pt up to negotiate stairs and with noted improvement in performance.  Written HEP provided and reviewed with pt and dtr.  Pt eager for dc home.   Follow Up Recommendations  Follow surgeon's recommendation for DC plan and follow-up therapies;Home health PT     Equipment Recommendations  Rolling walker with 5" wheels;3in1 (PT)    Recommendations for Other Services       Precautions / Restrictions Precautions Precautions: Fall;Knee Restrictions Weight Bearing Restrictions: No Other Position/Activity Restrictions: WBAT    Mobility  Bed Mobility Overal bed mobility: Needs Assistance Bed Mobility: Supine to Sit     Supine to sit: Supervision     General bed mobility comments: Pt up in chair and requests back to same   Transfers Overall transfer level: Needs assistance Equipment used: Rolling walker (2 wheeled) Transfers: Sit to/from Stand Sit to Stand: Min guard         General transfer comment: cues for LE management and use of UEs to self assist  Ambulation/Gait Ambulation/Gait assistance: Min guard;Supervision Gait Distance (Feet): 30 Feet Assistive device: Rolling walker (2 wheeled) Gait Pattern/deviations: Step-to pattern;Decreased step length - right;Decreased step length - left;Shuffle;Trunk flexed Gait velocity: decr   General Gait Details: Increased time with cues for sequence, posture and position from RW   Stairs Stairs: Yes Stairs assistance: Min assist Stair Management: No rails;Step to pattern;Backwards;With walker Number of Stairs: 2 General stair comments: single step bkwd twice with cues for sequence and foot/RW placement.  Fwd attempted but pt unable to WB sufficiently on R LE     Wheelchair Mobility    Modified Rankin (Stroke Patients Only)       Balance Overall balance assessment: Needs assistance Sitting-balance support: Feet supported Sitting balance-Leahy Scale: Good     Standing balance support: Bilateral upper extremity supported Standing balance-Leahy Scale: Fair Standing balance comment: pt standing at sink to wash hands with no external support                            Cognition Arousal/Alertness: Awake/alert Behavior During Therapy: WFL for tasks assessed/performed Overall Cognitive Status: History of cognitive impairments - at baseline Area of Impairment: Memory;Problem solving;Safety/judgement                     Memory: Decreased short-term memory Following Commands: Follows one step commands consistently Safety/Judgement: Decreased awareness of safety   Problem Solving: Requires verbal cues General Comments: repeatedly asks dtr to answer questions       Exercises Total Joint Exercises Ankle Circles/Pumps: AROM;Both;15 reps;Supine Quad Sets: AROM;Both;10 reps;Supine Heel Slides: AAROM;Right;Supine;15 reps Straight Leg Raises: AAROM;AROM;Right;Supine;20 reps Goniometric ROM: AAROM R knee -5 - 45    General Comments        Pertinent Vitals/Pain Pain Assessment: 0-10 Pain Score: 5  Pain Location: R knee Pain Descriptors / Indicators: Grimacing;Sore Pain Intervention(s): Limited activity within patient's tolerance;Monitored during session    Home Living                      Prior Function            PT Goals (current  goals can now be found in the care plan section) Acute Rehab PT Goals Patient Stated Goal: home PT Goal Formulation: With patient Time For Goal Achievement: 04/21/20 Potential to Achieve Goals: Good Progress towards PT goals: Progressing toward goals    Frequency    7X/week      PT Plan Current plan remains appropriate    Co-evaluation               AM-PAC PT "6 Clicks" Mobility   Outcome Measure  Help needed turning from your back to your side while in a flat bed without using bedrails?: A Little Help needed moving from lying on your back to sitting on the side of a flat bed without using bedrails?: A Little Help needed moving to and from a bed to a chair (including a wheelchair)?: A Little Help needed standing up from a chair using your arms (e.g., wheelchair or bedside chair)?: A Little Help needed to walk in hospital room?: A Little Help needed climbing 3-5 steps with a railing? : A Little 6 Click Score: 18    End of Session Equipment Utilized During Treatment: Gait belt Activity Tolerance: Patient tolerated treatment well;Patient limited by fatigue Patient left: in chair;with call bell/phone within reach;with family/visitor present Nurse Communication: Mobility status PT Visit Diagnosis: Difficulty in walking, not elsewhere classified (R26.2)     Time: 5366-4403 PT Time Calculation (min) (ACUTE ONLY): 20 min  Charges:  $Gait Training: 8-22 mins $Therapeutic Exercise: 8-22 mins $Therapeutic Activity: 8-22 mins                     Jessica Lopez PT Acute Rehabilitation Services Pager [phone] Office [phone]    Jessica Lopez 04/16/2020, 1:14 PM

## 2020-04-17 DIAGNOSIS — Z90710 Acquired absence of both cervix and uterus: Secondary | ICD-10-CM | POA: Diagnosis not present

## 2020-04-17 DIAGNOSIS — Z862 Personal history of diseases of the blood and blood-forming organs and certain disorders involving the immune mechanism: Secondary | ICD-10-CM | POA: Diagnosis not present

## 2020-04-17 DIAGNOSIS — Z87891 Personal history of nicotine dependence: Secondary | ICD-10-CM | POA: Diagnosis not present

## 2020-04-17 DIAGNOSIS — Z7901 Long term (current) use of anticoagulants: Secondary | ICD-10-CM | POA: Diagnosis not present

## 2020-04-17 DIAGNOSIS — Z7982 Long term (current) use of aspirin: Secondary | ICD-10-CM | POA: Diagnosis not present

## 2020-04-17 DIAGNOSIS — T84032D Mechanical loosening of internal right knee prosthetic joint, subsequent encounter: Secondary | ICD-10-CM | POA: Diagnosis not present

## 2020-04-17 DIAGNOSIS — G43909 Migraine, unspecified, not intractable, without status migrainosus: Secondary | ICD-10-CM | POA: Diagnosis not present

## 2020-04-17 DIAGNOSIS — I110 Hypertensive heart disease with heart failure: Secondary | ICD-10-CM | POA: Diagnosis not present

## 2020-04-17 DIAGNOSIS — K5909 Other constipation: Secondary | ICD-10-CM | POA: Diagnosis not present

## 2020-04-17 DIAGNOSIS — F419 Anxiety disorder, unspecified: Secondary | ICD-10-CM | POA: Diagnosis not present

## 2020-04-17 DIAGNOSIS — Z79891 Long term (current) use of opiate analgesic: Secondary | ICD-10-CM | POA: Diagnosis not present

## 2020-04-17 DIAGNOSIS — Z96652 Presence of left artificial knee joint: Secondary | ICD-10-CM | POA: Diagnosis not present

## 2020-04-17 DIAGNOSIS — I509 Heart failure, unspecified: Secondary | ICD-10-CM | POA: Diagnosis not present

## 2020-04-17 DIAGNOSIS — M1991 Primary osteoarthritis, unspecified site: Secondary | ICD-10-CM | POA: Diagnosis not present

## 2020-04-17 DIAGNOSIS — Z4789 Encounter for other orthopedic aftercare: Secondary | ICD-10-CM | POA: Diagnosis not present

## 2020-04-17 DIAGNOSIS — M545 Low back pain: Secondary | ICD-10-CM | POA: Diagnosis not present

## 2020-04-19 DIAGNOSIS — T84032D Mechanical loosening of internal right knee prosthetic joint, subsequent encounter: Secondary | ICD-10-CM | POA: Diagnosis not present

## 2020-04-19 DIAGNOSIS — I110 Hypertensive heart disease with heart failure: Secondary | ICD-10-CM | POA: Diagnosis not present

## 2020-04-19 DIAGNOSIS — F419 Anxiety disorder, unspecified: Secondary | ICD-10-CM | POA: Diagnosis not present

## 2020-04-19 DIAGNOSIS — M1991 Primary osteoarthritis, unspecified site: Secondary | ICD-10-CM | POA: Diagnosis not present

## 2020-04-19 DIAGNOSIS — G43909 Migraine, unspecified, not intractable, without status migrainosus: Secondary | ICD-10-CM | POA: Diagnosis not present

## 2020-04-19 DIAGNOSIS — I509 Heart failure, unspecified: Secondary | ICD-10-CM | POA: Diagnosis not present

## 2020-04-19 LAB — AEROBIC/ANAEROBIC CULTURE W GRAM STAIN (SURGICAL/DEEP WOUND)
Culture: NO GROWTH
Gram Stain: NONE SEEN

## 2020-04-21 DIAGNOSIS — G309 Alzheimer's disease, unspecified: Secondary | ICD-10-CM | POA: Diagnosis not present

## 2020-04-21 DIAGNOSIS — I10 Essential (primary) hypertension: Secondary | ICD-10-CM | POA: Diagnosis not present

## 2020-04-21 DIAGNOSIS — J449 Chronic obstructive pulmonary disease, unspecified: Secondary | ICD-10-CM | POA: Diagnosis not present

## 2020-04-21 DIAGNOSIS — Z6834 Body mass index (BMI) 34.0-34.9, adult: Secondary | ICD-10-CM | POA: Diagnosis not present

## 2020-04-21 DIAGNOSIS — Z299 Encounter for prophylactic measures, unspecified: Secondary | ICD-10-CM | POA: Diagnosis not present

## 2020-04-21 DIAGNOSIS — I509 Heart failure, unspecified: Secondary | ICD-10-CM | POA: Diagnosis not present

## 2020-04-23 DIAGNOSIS — T84032D Mechanical loosening of internal right knee prosthetic joint, subsequent encounter: Secondary | ICD-10-CM | POA: Diagnosis not present

## 2020-04-23 DIAGNOSIS — I110 Hypertensive heart disease with heart failure: Secondary | ICD-10-CM | POA: Diagnosis not present

## 2020-04-23 DIAGNOSIS — F419 Anxiety disorder, unspecified: Secondary | ICD-10-CM | POA: Diagnosis not present

## 2020-04-23 DIAGNOSIS — G43909 Migraine, unspecified, not intractable, without status migrainosus: Secondary | ICD-10-CM | POA: Diagnosis not present

## 2020-04-23 DIAGNOSIS — I509 Heart failure, unspecified: Secondary | ICD-10-CM | POA: Diagnosis not present

## 2020-04-23 DIAGNOSIS — M1991 Primary osteoarthritis, unspecified site: Secondary | ICD-10-CM | POA: Diagnosis not present

## 2020-04-27 DIAGNOSIS — G43909 Migraine, unspecified, not intractable, without status migrainosus: Secondary | ICD-10-CM | POA: Diagnosis not present

## 2020-04-27 DIAGNOSIS — M1991 Primary osteoarthritis, unspecified site: Secondary | ICD-10-CM | POA: Diagnosis not present

## 2020-04-27 DIAGNOSIS — I509 Heart failure, unspecified: Secondary | ICD-10-CM | POA: Diagnosis not present

## 2020-04-27 DIAGNOSIS — F419 Anxiety disorder, unspecified: Secondary | ICD-10-CM | POA: Diagnosis not present

## 2020-04-27 DIAGNOSIS — Z96651 Presence of right artificial knee joint: Secondary | ICD-10-CM | POA: Diagnosis not present

## 2020-04-27 DIAGNOSIS — I110 Hypertensive heart disease with heart failure: Secondary | ICD-10-CM | POA: Diagnosis not present

## 2020-04-27 DIAGNOSIS — T84032D Mechanical loosening of internal right knee prosthetic joint, subsequent encounter: Secondary | ICD-10-CM | POA: Diagnosis not present

## 2020-04-27 DIAGNOSIS — Z471 Aftercare following joint replacement surgery: Secondary | ICD-10-CM | POA: Diagnosis not present

## 2020-04-29 DIAGNOSIS — I509 Heart failure, unspecified: Secondary | ICD-10-CM | POA: Diagnosis not present

## 2020-04-29 DIAGNOSIS — I10 Essential (primary) hypertension: Secondary | ICD-10-CM | POA: Diagnosis not present

## 2020-04-29 DIAGNOSIS — J449 Chronic obstructive pulmonary disease, unspecified: Secondary | ICD-10-CM | POA: Diagnosis not present

## 2020-04-29 DIAGNOSIS — I2510 Atherosclerotic heart disease of native coronary artery without angina pectoris: Secondary | ICD-10-CM | POA: Diagnosis not present

## 2020-04-30 DIAGNOSIS — G43909 Migraine, unspecified, not intractable, without status migrainosus: Secondary | ICD-10-CM | POA: Diagnosis not present

## 2020-04-30 DIAGNOSIS — I509 Heart failure, unspecified: Secondary | ICD-10-CM | POA: Diagnosis not present

## 2020-04-30 DIAGNOSIS — T84032D Mechanical loosening of internal right knee prosthetic joint, subsequent encounter: Secondary | ICD-10-CM | POA: Diagnosis not present

## 2020-04-30 DIAGNOSIS — I110 Hypertensive heart disease with heart failure: Secondary | ICD-10-CM | POA: Diagnosis not present

## 2020-04-30 DIAGNOSIS — F419 Anxiety disorder, unspecified: Secondary | ICD-10-CM | POA: Diagnosis not present

## 2020-04-30 DIAGNOSIS — M1991 Primary osteoarthritis, unspecified site: Secondary | ICD-10-CM | POA: Diagnosis not present

## 2020-05-03 DIAGNOSIS — M1991 Primary osteoarthritis, unspecified site: Secondary | ICD-10-CM | POA: Diagnosis not present

## 2020-05-03 DIAGNOSIS — T84032D Mechanical loosening of internal right knee prosthetic joint, subsequent encounter: Secondary | ICD-10-CM | POA: Diagnosis not present

## 2020-05-03 DIAGNOSIS — G43909 Migraine, unspecified, not intractable, without status migrainosus: Secondary | ICD-10-CM | POA: Diagnosis not present

## 2020-05-03 DIAGNOSIS — I110 Hypertensive heart disease with heart failure: Secondary | ICD-10-CM | POA: Diagnosis not present

## 2020-05-03 DIAGNOSIS — F419 Anxiety disorder, unspecified: Secondary | ICD-10-CM | POA: Diagnosis not present

## 2020-05-03 DIAGNOSIS — I509 Heart failure, unspecified: Secondary | ICD-10-CM | POA: Diagnosis not present

## 2020-05-06 DIAGNOSIS — G43909 Migraine, unspecified, not intractable, without status migrainosus: Secondary | ICD-10-CM | POA: Diagnosis not present

## 2020-05-06 DIAGNOSIS — I509 Heart failure, unspecified: Secondary | ICD-10-CM | POA: Diagnosis not present

## 2020-05-06 DIAGNOSIS — M1991 Primary osteoarthritis, unspecified site: Secondary | ICD-10-CM | POA: Diagnosis not present

## 2020-05-06 DIAGNOSIS — I110 Hypertensive heart disease with heart failure: Secondary | ICD-10-CM | POA: Diagnosis not present

## 2020-05-06 DIAGNOSIS — T84032D Mechanical loosening of internal right knee prosthetic joint, subsequent encounter: Secondary | ICD-10-CM | POA: Diagnosis not present

## 2020-05-06 DIAGNOSIS — F419 Anxiety disorder, unspecified: Secondary | ICD-10-CM | POA: Diagnosis not present

## 2020-05-10 DIAGNOSIS — M1991 Primary osteoarthritis, unspecified site: Secondary | ICD-10-CM | POA: Diagnosis not present

## 2020-05-10 DIAGNOSIS — G43909 Migraine, unspecified, not intractable, without status migrainosus: Secondary | ICD-10-CM | POA: Diagnosis not present

## 2020-05-10 DIAGNOSIS — F419 Anxiety disorder, unspecified: Secondary | ICD-10-CM | POA: Diagnosis not present

## 2020-05-10 DIAGNOSIS — T84032D Mechanical loosening of internal right knee prosthetic joint, subsequent encounter: Secondary | ICD-10-CM | POA: Diagnosis not present

## 2020-05-10 DIAGNOSIS — I110 Hypertensive heart disease with heart failure: Secondary | ICD-10-CM | POA: Diagnosis not present

## 2020-05-10 DIAGNOSIS — I509 Heart failure, unspecified: Secondary | ICD-10-CM | POA: Diagnosis not present

## 2020-05-13 DIAGNOSIS — G43909 Migraine, unspecified, not intractable, without status migrainosus: Secondary | ICD-10-CM | POA: Diagnosis not present

## 2020-05-13 DIAGNOSIS — I509 Heart failure, unspecified: Secondary | ICD-10-CM | POA: Diagnosis not present

## 2020-05-13 DIAGNOSIS — I110 Hypertensive heart disease with heart failure: Secondary | ICD-10-CM | POA: Diagnosis not present

## 2020-05-13 DIAGNOSIS — M1991 Primary osteoarthritis, unspecified site: Secondary | ICD-10-CM | POA: Diagnosis not present

## 2020-05-13 DIAGNOSIS — T84032D Mechanical loosening of internal right knee prosthetic joint, subsequent encounter: Secondary | ICD-10-CM | POA: Diagnosis not present

## 2020-05-13 DIAGNOSIS — F419 Anxiety disorder, unspecified: Secondary | ICD-10-CM | POA: Diagnosis not present

## 2020-05-17 DIAGNOSIS — G43909 Migraine, unspecified, not intractable, without status migrainosus: Secondary | ICD-10-CM | POA: Diagnosis not present

## 2020-05-17 DIAGNOSIS — K5909 Other constipation: Secondary | ICD-10-CM | POA: Diagnosis not present

## 2020-05-17 DIAGNOSIS — T84032D Mechanical loosening of internal right knee prosthetic joint, subsequent encounter: Secondary | ICD-10-CM | POA: Diagnosis not present

## 2020-05-17 DIAGNOSIS — Z7901 Long term (current) use of anticoagulants: Secondary | ICD-10-CM | POA: Diagnosis not present

## 2020-05-17 DIAGNOSIS — M545 Low back pain, unspecified: Secondary | ICD-10-CM | POA: Diagnosis not present

## 2020-05-17 DIAGNOSIS — M1991 Primary osteoarthritis, unspecified site: Secondary | ICD-10-CM | POA: Diagnosis not present

## 2020-05-17 DIAGNOSIS — Z90710 Acquired absence of both cervix and uterus: Secondary | ICD-10-CM | POA: Diagnosis not present

## 2020-05-17 DIAGNOSIS — Z87891 Personal history of nicotine dependence: Secondary | ICD-10-CM | POA: Diagnosis not present

## 2020-05-17 DIAGNOSIS — Z862 Personal history of diseases of the blood and blood-forming organs and certain disorders involving the immune mechanism: Secondary | ICD-10-CM | POA: Diagnosis not present

## 2020-05-17 DIAGNOSIS — I110 Hypertensive heart disease with heart failure: Secondary | ICD-10-CM | POA: Diagnosis not present

## 2020-05-17 DIAGNOSIS — F419 Anxiety disorder, unspecified: Secondary | ICD-10-CM | POA: Diagnosis not present

## 2020-05-17 DIAGNOSIS — Z96652 Presence of left artificial knee joint: Secondary | ICD-10-CM | POA: Diagnosis not present

## 2020-05-17 DIAGNOSIS — I509 Heart failure, unspecified: Secondary | ICD-10-CM | POA: Diagnosis not present

## 2020-05-17 DIAGNOSIS — Z79891 Long term (current) use of opiate analgesic: Secondary | ICD-10-CM | POA: Diagnosis not present

## 2020-05-17 DIAGNOSIS — Z7982 Long term (current) use of aspirin: Secondary | ICD-10-CM | POA: Diagnosis not present

## 2020-05-19 DIAGNOSIS — F419 Anxiety disorder, unspecified: Secondary | ICD-10-CM | POA: Diagnosis not present

## 2020-05-19 DIAGNOSIS — G43909 Migraine, unspecified, not intractable, without status migrainosus: Secondary | ICD-10-CM | POA: Diagnosis not present

## 2020-05-19 DIAGNOSIS — I509 Heart failure, unspecified: Secondary | ICD-10-CM | POA: Diagnosis not present

## 2020-05-19 DIAGNOSIS — I110 Hypertensive heart disease with heart failure: Secondary | ICD-10-CM | POA: Diagnosis not present

## 2020-05-19 DIAGNOSIS — T84032D Mechanical loosening of internal right knee prosthetic joint, subsequent encounter: Secondary | ICD-10-CM | POA: Diagnosis not present

## 2020-05-19 DIAGNOSIS — M1991 Primary osteoarthritis, unspecified site: Secondary | ICD-10-CM | POA: Diagnosis not present

## 2020-05-25 DIAGNOSIS — Z471 Aftercare following joint replacement surgery: Secondary | ICD-10-CM | POA: Diagnosis not present

## 2020-05-25 DIAGNOSIS — Z96651 Presence of right artificial knee joint: Secondary | ICD-10-CM | POA: Diagnosis not present

## 2020-05-26 DIAGNOSIS — G43909 Migraine, unspecified, not intractable, without status migrainosus: Secondary | ICD-10-CM | POA: Diagnosis not present

## 2020-05-26 DIAGNOSIS — M1991 Primary osteoarthritis, unspecified site: Secondary | ICD-10-CM | POA: Diagnosis not present

## 2020-05-26 DIAGNOSIS — I509 Heart failure, unspecified: Secondary | ICD-10-CM | POA: Diagnosis not present

## 2020-05-26 DIAGNOSIS — T84032D Mechanical loosening of internal right knee prosthetic joint, subsequent encounter: Secondary | ICD-10-CM | POA: Diagnosis not present

## 2020-05-26 DIAGNOSIS — I110 Hypertensive heart disease with heart failure: Secondary | ICD-10-CM | POA: Diagnosis not present

## 2020-05-26 DIAGNOSIS — F419 Anxiety disorder, unspecified: Secondary | ICD-10-CM | POA: Diagnosis not present

## 2020-05-28 DIAGNOSIS — T84032D Mechanical loosening of internal right knee prosthetic joint, subsequent encounter: Secondary | ICD-10-CM | POA: Diagnosis not present

## 2020-05-28 DIAGNOSIS — I110 Hypertensive heart disease with heart failure: Secondary | ICD-10-CM | POA: Diagnosis not present

## 2020-05-28 DIAGNOSIS — I509 Heart failure, unspecified: Secondary | ICD-10-CM | POA: Diagnosis not present

## 2020-05-28 DIAGNOSIS — M1991 Primary osteoarthritis, unspecified site: Secondary | ICD-10-CM | POA: Diagnosis not present

## 2020-05-28 DIAGNOSIS — G43909 Migraine, unspecified, not intractable, without status migrainosus: Secondary | ICD-10-CM | POA: Diagnosis not present

## 2020-05-28 DIAGNOSIS — F419 Anxiety disorder, unspecified: Secondary | ICD-10-CM | POA: Diagnosis not present

## 2020-06-04 DIAGNOSIS — M25561 Pain in right knee: Secondary | ICD-10-CM | POA: Diagnosis not present

## 2020-06-04 DIAGNOSIS — R2689 Other abnormalities of gait and mobility: Secondary | ICD-10-CM | POA: Diagnosis not present

## 2020-06-04 DIAGNOSIS — M25562 Pain in left knee: Secondary | ICD-10-CM | POA: Diagnosis not present

## 2020-06-10 DIAGNOSIS — M25562 Pain in left knee: Secondary | ICD-10-CM | POA: Diagnosis not present

## 2020-06-10 DIAGNOSIS — M25561 Pain in right knee: Secondary | ICD-10-CM | POA: Diagnosis not present

## 2020-06-10 DIAGNOSIS — R2689 Other abnormalities of gait and mobility: Secondary | ICD-10-CM | POA: Diagnosis not present

## 2020-06-10 DIAGNOSIS — Z23 Encounter for immunization: Secondary | ICD-10-CM | POA: Diagnosis not present

## 2020-06-14 DIAGNOSIS — M25562 Pain in left knee: Secondary | ICD-10-CM | POA: Diagnosis not present

## 2020-06-14 DIAGNOSIS — R2689 Other abnormalities of gait and mobility: Secondary | ICD-10-CM | POA: Diagnosis not present

## 2020-06-14 DIAGNOSIS — M25561 Pain in right knee: Secondary | ICD-10-CM | POA: Diagnosis not present

## 2020-06-21 DIAGNOSIS — R2689 Other abnormalities of gait and mobility: Secondary | ICD-10-CM | POA: Diagnosis not present

## 2020-06-21 DIAGNOSIS — M25561 Pain in right knee: Secondary | ICD-10-CM | POA: Diagnosis not present

## 2020-06-21 DIAGNOSIS — M25562 Pain in left knee: Secondary | ICD-10-CM | POA: Diagnosis not present

## 2020-06-25 DIAGNOSIS — Z6834 Body mass index (BMI) 34.0-34.9, adult: Secondary | ICD-10-CM | POA: Diagnosis not present

## 2020-06-25 DIAGNOSIS — F0280 Dementia in other diseases classified elsewhere without behavioral disturbance: Secondary | ICD-10-CM | POA: Diagnosis not present

## 2020-06-25 DIAGNOSIS — Z299 Encounter for prophylactic measures, unspecified: Secondary | ICD-10-CM | POA: Diagnosis not present

## 2020-06-25 DIAGNOSIS — L509 Urticaria, unspecified: Secondary | ICD-10-CM | POA: Diagnosis not present

## 2020-06-25 DIAGNOSIS — I509 Heart failure, unspecified: Secondary | ICD-10-CM | POA: Diagnosis not present

## 2020-06-25 DIAGNOSIS — G309 Alzheimer's disease, unspecified: Secondary | ICD-10-CM | POA: Diagnosis not present

## 2020-06-28 DIAGNOSIS — M25562 Pain in left knee: Secondary | ICD-10-CM | POA: Diagnosis not present

## 2020-06-28 DIAGNOSIS — R2689 Other abnormalities of gait and mobility: Secondary | ICD-10-CM | POA: Diagnosis not present

## 2020-06-28 DIAGNOSIS — M25561 Pain in right knee: Secondary | ICD-10-CM | POA: Diagnosis not present

## 2020-06-29 DIAGNOSIS — J449 Chronic obstructive pulmonary disease, unspecified: Secondary | ICD-10-CM | POA: Diagnosis not present

## 2020-06-29 DIAGNOSIS — I509 Heart failure, unspecified: Secondary | ICD-10-CM | POA: Diagnosis not present

## 2020-06-29 DIAGNOSIS — I2510 Atherosclerotic heart disease of native coronary artery without angina pectoris: Secondary | ICD-10-CM | POA: Diagnosis not present

## 2020-06-29 DIAGNOSIS — I10 Essential (primary) hypertension: Secondary | ICD-10-CM | POA: Diagnosis not present

## 2020-07-01 DIAGNOSIS — M25561 Pain in right knee: Secondary | ICD-10-CM | POA: Diagnosis not present

## 2020-07-01 DIAGNOSIS — R2689 Other abnormalities of gait and mobility: Secondary | ICD-10-CM | POA: Diagnosis not present

## 2020-07-01 DIAGNOSIS — M25562 Pain in left knee: Secondary | ICD-10-CM | POA: Diagnosis not present

## 2020-07-06 DIAGNOSIS — Z96651 Presence of right artificial knee joint: Secondary | ICD-10-CM | POA: Diagnosis not present

## 2020-07-06 DIAGNOSIS — Z471 Aftercare following joint replacement surgery: Secondary | ICD-10-CM | POA: Diagnosis not present

## 2020-07-22 DIAGNOSIS — G4700 Insomnia, unspecified: Secondary | ICD-10-CM | POA: Diagnosis not present

## 2020-07-22 DIAGNOSIS — J449 Chronic obstructive pulmonary disease, unspecified: Secondary | ICD-10-CM | POA: Diagnosis not present

## 2020-07-22 DIAGNOSIS — I509 Heart failure, unspecified: Secondary | ICD-10-CM | POA: Diagnosis not present

## 2020-07-22 DIAGNOSIS — M1990 Unspecified osteoarthritis, unspecified site: Secondary | ICD-10-CM | POA: Diagnosis not present

## 2020-07-22 DIAGNOSIS — Z299 Encounter for prophylactic measures, unspecified: Secondary | ICD-10-CM | POA: Diagnosis not present

## 2020-07-28 DIAGNOSIS — F0390 Unspecified dementia without behavioral disturbance: Secondary | ICD-10-CM | POA: Diagnosis not present

## 2020-07-28 DIAGNOSIS — Z79891 Long term (current) use of opiate analgesic: Secondary | ICD-10-CM | POA: Diagnosis not present

## 2020-07-28 DIAGNOSIS — T8484XD Pain due to internal orthopedic prosthetic devices, implants and grafts, subsequent encounter: Secondary | ICD-10-CM | POA: Diagnosis not present

## 2020-07-29 DIAGNOSIS — I10 Essential (primary) hypertension: Secondary | ICD-10-CM | POA: Diagnosis not present

## 2020-07-29 DIAGNOSIS — J449 Chronic obstructive pulmonary disease, unspecified: Secondary | ICD-10-CM | POA: Diagnosis not present

## 2020-07-29 DIAGNOSIS — I2510 Atherosclerotic heart disease of native coronary artery without angina pectoris: Secondary | ICD-10-CM | POA: Diagnosis not present

## 2020-07-29 DIAGNOSIS — I509 Heart failure, unspecified: Secondary | ICD-10-CM | POA: Diagnosis not present

## 2020-08-23 DIAGNOSIS — G894 Chronic pain syndrome: Secondary | ICD-10-CM | POA: Diagnosis not present

## 2020-09-20 DIAGNOSIS — Z79891 Long term (current) use of opiate analgesic: Secondary | ICD-10-CM | POA: Diagnosis not present

## 2020-09-20 DIAGNOSIS — M25561 Pain in right knee: Secondary | ICD-10-CM | POA: Diagnosis not present

## 2020-09-20 DIAGNOSIS — M25562 Pain in left knee: Secondary | ICD-10-CM | POA: Diagnosis not present

## 2020-10-19 DIAGNOSIS — Z79891 Long term (current) use of opiate analgesic: Secondary | ICD-10-CM | POA: Diagnosis not present

## 2020-10-19 DIAGNOSIS — M792 Neuralgia and neuritis, unspecified: Secondary | ICD-10-CM | POA: Diagnosis not present

## 2020-10-19 DIAGNOSIS — T402X5D Adverse effect of other opioids, subsequent encounter: Secondary | ICD-10-CM | POA: Diagnosis not present

## 2020-10-19 DIAGNOSIS — T8484XD Pain due to internal orthopedic prosthetic devices, implants and grafts, subsequent encounter: Secondary | ICD-10-CM | POA: Diagnosis not present

## 2020-11-11 DIAGNOSIS — M792 Neuralgia and neuritis, unspecified: Secondary | ICD-10-CM | POA: Diagnosis not present

## 2020-12-02 DIAGNOSIS — K5903 Drug induced constipation: Secondary | ICD-10-CM | POA: Diagnosis not present

## 2020-12-02 DIAGNOSIS — M1711 Unilateral primary osteoarthritis, right knee: Secondary | ICD-10-CM | POA: Diagnosis not present

## 2020-12-02 DIAGNOSIS — G894 Chronic pain syndrome: Secondary | ICD-10-CM | POA: Diagnosis not present

## 2020-12-08 DIAGNOSIS — I270 Primary pulmonary hypertension: Secondary | ICD-10-CM | POA: Diagnosis not present

## 2020-12-08 DIAGNOSIS — I509 Heart failure, unspecified: Secondary | ICD-10-CM | POA: Diagnosis not present

## 2020-12-08 DIAGNOSIS — Z87891 Personal history of nicotine dependence: Secondary | ICD-10-CM | POA: Diagnosis not present

## 2020-12-08 DIAGNOSIS — Z6834 Body mass index (BMI) 34.0-34.9, adult: Secondary | ICD-10-CM | POA: Diagnosis not present

## 2020-12-08 DIAGNOSIS — I739 Peripheral vascular disease, unspecified: Secondary | ICD-10-CM | POA: Diagnosis not present

## 2020-12-08 DIAGNOSIS — Z299 Encounter for prophylactic measures, unspecified: Secondary | ICD-10-CM | POA: Diagnosis not present

## 2020-12-08 DIAGNOSIS — I10 Essential (primary) hypertension: Secondary | ICD-10-CM | POA: Diagnosis not present

## 2021-02-27 DIAGNOSIS — I10 Essential (primary) hypertension: Secondary | ICD-10-CM | POA: Diagnosis not present

## 2021-02-27 DIAGNOSIS — M1990 Unspecified osteoarthritis, unspecified site: Secondary | ICD-10-CM | POA: Diagnosis not present

## 2021-03-04 DIAGNOSIS — T8484XD Pain due to internal orthopedic prosthetic devices, implants and grafts, subsequent encounter: Secondary | ICD-10-CM | POA: Diagnosis not present

## 2021-03-04 DIAGNOSIS — G894 Chronic pain syndrome: Secondary | ICD-10-CM | POA: Diagnosis not present

## 2021-03-04 DIAGNOSIS — M179 Osteoarthritis of knee, unspecified: Secondary | ICD-10-CM | POA: Diagnosis not present

## 2021-03-04 DIAGNOSIS — Z79891 Long term (current) use of opiate analgesic: Secondary | ICD-10-CM | POA: Diagnosis not present

## 2021-04-12 DIAGNOSIS — I10 Essential (primary) hypertension: Secondary | ICD-10-CM | POA: Diagnosis not present

## 2021-04-12 DIAGNOSIS — Z87891 Personal history of nicotine dependence: Secondary | ICD-10-CM | POA: Diagnosis not present

## 2021-04-12 DIAGNOSIS — Z6835 Body mass index (BMI) 35.0-35.9, adult: Secondary | ICD-10-CM | POA: Diagnosis not present

## 2021-04-12 DIAGNOSIS — F0280 Dementia in other diseases classified elsewhere without behavioral disturbance: Secondary | ICD-10-CM | POA: Diagnosis not present

## 2021-04-12 DIAGNOSIS — Z299 Encounter for prophylactic measures, unspecified: Secondary | ICD-10-CM | POA: Diagnosis not present

## 2021-04-12 DIAGNOSIS — G309 Alzheimer's disease, unspecified: Secondary | ICD-10-CM | POA: Diagnosis not present

## 2021-04-12 DIAGNOSIS — J449 Chronic obstructive pulmonary disease, unspecified: Secondary | ICD-10-CM | POA: Diagnosis not present

## 2021-04-12 DIAGNOSIS — Z23 Encounter for immunization: Secondary | ICD-10-CM | POA: Diagnosis not present

## 2021-04-12 DIAGNOSIS — R600 Localized edema: Secondary | ICD-10-CM | POA: Diagnosis not present

## 2021-04-29 DIAGNOSIS — I10 Essential (primary) hypertension: Secondary | ICD-10-CM | POA: Diagnosis not present

## 2021-04-29 DIAGNOSIS — M1990 Unspecified osteoarthritis, unspecified site: Secondary | ICD-10-CM | POA: Diagnosis not present

## 2021-07-07 DIAGNOSIS — R5383 Other fatigue: Secondary | ICD-10-CM | POA: Diagnosis not present

## 2021-07-07 DIAGNOSIS — Z0000 Encounter for general adult medical examination without abnormal findings: Secondary | ICD-10-CM | POA: Diagnosis not present

## 2021-07-07 DIAGNOSIS — I10 Essential (primary) hypertension: Secondary | ICD-10-CM | POA: Diagnosis not present

## 2021-07-07 DIAGNOSIS — E559 Vitamin D deficiency, unspecified: Secondary | ICD-10-CM | POA: Diagnosis not present

## 2021-07-07 DIAGNOSIS — Z1339 Encounter for screening examination for other mental health and behavioral disorders: Secondary | ICD-10-CM | POA: Diagnosis not present

## 2021-07-07 DIAGNOSIS — F419 Anxiety disorder, unspecified: Secondary | ICD-10-CM | POA: Diagnosis not present

## 2021-07-07 DIAGNOSIS — Z6835 Body mass index (BMI) 35.0-35.9, adult: Secondary | ICD-10-CM | POA: Diagnosis not present

## 2021-07-07 DIAGNOSIS — Z299 Encounter for prophylactic measures, unspecified: Secondary | ICD-10-CM | POA: Diagnosis not present

## 2021-07-07 DIAGNOSIS — Z7189 Other specified counseling: Secondary | ICD-10-CM | POA: Diagnosis not present

## 2021-07-07 DIAGNOSIS — Z1331 Encounter for screening for depression: Secondary | ICD-10-CM | POA: Diagnosis not present

## 2021-10-07 DIAGNOSIS — F419 Anxiety disorder, unspecified: Secondary | ICD-10-CM | POA: Diagnosis not present

## 2021-10-07 DIAGNOSIS — Z789 Other specified health status: Secondary | ICD-10-CM | POA: Diagnosis not present

## 2021-10-07 DIAGNOSIS — Z299 Encounter for prophylactic measures, unspecified: Secondary | ICD-10-CM | POA: Diagnosis not present

## 2021-10-07 DIAGNOSIS — G4700 Insomnia, unspecified: Secondary | ICD-10-CM | POA: Diagnosis not present

## 2021-11-15 ENCOUNTER — Encounter: Payer: Self-pay | Admitting: *Deleted

## 2021-12-30 DIAGNOSIS — Z299 Encounter for prophylactic measures, unspecified: Secondary | ICD-10-CM | POA: Diagnosis not present

## 2021-12-30 DIAGNOSIS — F0280 Dementia in other diseases classified elsewhere without behavioral disturbance: Secondary | ICD-10-CM | POA: Diagnosis not present

## 2021-12-30 DIAGNOSIS — F419 Anxiety disorder, unspecified: Secondary | ICD-10-CM | POA: Diagnosis not present

## 2021-12-30 DIAGNOSIS — G309 Alzheimer's disease, unspecified: Secondary | ICD-10-CM | POA: Diagnosis not present

## 2021-12-30 DIAGNOSIS — G4700 Insomnia, unspecified: Secondary | ICD-10-CM | POA: Diagnosis not present

## 2022-01-27 DIAGNOSIS — M1990 Unspecified osteoarthritis, unspecified site: Secondary | ICD-10-CM | POA: Diagnosis not present

## 2022-01-27 DIAGNOSIS — I10 Essential (primary) hypertension: Secondary | ICD-10-CM | POA: Diagnosis not present

## 2022-03-31 DEATH — deceased
